# Patient Record
Sex: Female | Born: 1950 | Race: White | Hispanic: No | Marital: Married | Smoking: Former smoker
Health system: Southern US, Community
[De-identification: ages and names within clinical notes are randomized; demographics above are authoritative.]

## PROBLEM LIST (undated history)

## (undated) DIAGNOSIS — I519 Heart disease, unspecified: Secondary | ICD-10-CM

## (undated) DIAGNOSIS — D649 Anemia, unspecified: Secondary | ICD-10-CM

## (undated) DIAGNOSIS — K911 Postgastric surgery syndromes: Secondary | ICD-10-CM

## (undated) DIAGNOSIS — K52831 Collagenous colitis: Secondary | ICD-10-CM

## (undated) DIAGNOSIS — R6889 Other general symptoms and signs: Secondary | ICD-10-CM

## (undated) DIAGNOSIS — F419 Anxiety disorder, unspecified: Secondary | ICD-10-CM

## (undated) DIAGNOSIS — M1990 Unspecified osteoarthritis, unspecified site: Secondary | ICD-10-CM

## (undated) DIAGNOSIS — F329 Major depressive disorder, single episode, unspecified: Secondary | ICD-10-CM

## (undated) DIAGNOSIS — D72829 Elevated white blood cell count, unspecified: Secondary | ICD-10-CM

## (undated) DIAGNOSIS — I890 Lymphedema, not elsewhere classified: Secondary | ICD-10-CM

## (undated) DIAGNOSIS — F32A Depression, unspecified: Secondary | ICD-10-CM

## (undated) DIAGNOSIS — K2900 Acute gastritis without bleeding: Secondary | ICD-10-CM

## (undated) DIAGNOSIS — K8689 Other specified diseases of pancreas: Secondary | ICD-10-CM

## (undated) DIAGNOSIS — K2270 Barrett's esophagus without dysplasia: Secondary | ICD-10-CM

## (undated) DIAGNOSIS — E785 Hyperlipidemia, unspecified: Secondary | ICD-10-CM

## (undated) DIAGNOSIS — I10 Essential (primary) hypertension: Secondary | ICD-10-CM

## (undated) DIAGNOSIS — K5790 Diverticulosis of intestine, part unspecified, without perforation or abscess without bleeding: Secondary | ICD-10-CM

## (undated) HISTORY — DX: Acute gastritis without bleeding: K29.00

## (undated) HISTORY — DX: Anemia, unspecified: D64.9

## (undated) HISTORY — DX: Barrett's esophagus without dysplasia: K22.70

## (undated) HISTORY — DX: Collagenous colitis: K52.831

## (undated) HISTORY — DX: Other specified diseases of pancreas: K86.89

## (undated) HISTORY — DX: Heart disease, unspecified: I51.9

## (undated) HISTORY — DX: Major depressive disorder, single episode, unspecified: F32.9

## (undated) HISTORY — DX: Postgastric surgery syndromes: K91.1

## (undated) HISTORY — DX: Anxiety disorder, unspecified: F41.9

## (undated) HISTORY — DX: Lymphedema, not elsewhere classified: I89.0

## (undated) HISTORY — DX: Elevated white blood cell count, unspecified: D72.829

## (undated) HISTORY — PX: TONSILLECTOMY: SUR1361

## (undated) HISTORY — DX: Other general symptoms and signs: R68.89

## (undated) HISTORY — DX: Hyperlipidemia, unspecified: E78.5

## (undated) HISTORY — DX: Unspecified osteoarthritis, unspecified site: M19.90

## (undated) HISTORY — DX: Depression, unspecified: F32.A

## (undated) HISTORY — DX: Essential (primary) hypertension: I10

## (undated) HISTORY — DX: Diverticulosis of intestine, part unspecified, without perforation or abscess without bleeding: K57.90

## (undated) HISTORY — PX: TUBAL LIGATION: SHX77

---

## 1998-07-17 ENCOUNTER — Other Ambulatory Visit: Admission: RE | Admit: 1998-07-17 | Discharge: 1998-07-17 | Payer: Self-pay | Admitting: Obstetrics and Gynecology

## 1999-12-16 ENCOUNTER — Other Ambulatory Visit: Admission: RE | Admit: 1999-12-16 | Discharge: 1999-12-16 | Payer: Self-pay | Admitting: Obstetrics and Gynecology

## 2001-06-26 ENCOUNTER — Other Ambulatory Visit: Admission: RE | Admit: 2001-06-26 | Discharge: 2001-06-26 | Payer: Self-pay | Admitting: Obstetrics and Gynecology

## 2004-12-17 ENCOUNTER — Encounter: Admission: RE | Admit: 2004-12-17 | Discharge: 2004-12-17 | Payer: Self-pay | Admitting: Internal Medicine

## 2005-02-11 ENCOUNTER — Encounter: Admission: RE | Admit: 2005-02-11 | Discharge: 2005-02-11 | Payer: Self-pay | Admitting: Internal Medicine

## 2007-05-24 DIAGNOSIS — R6889 Other general symptoms and signs: Secondary | ICD-10-CM

## 2007-05-24 HISTORY — DX: Other general symptoms and signs: R68.89

## 2008-02-20 DIAGNOSIS — K8689 Other specified diseases of pancreas: Secondary | ICD-10-CM

## 2008-02-20 HISTORY — DX: Other specified diseases of pancreas: K86.89

## 2010-02-11 ENCOUNTER — Encounter: Payer: Self-pay | Admitting: Family Medicine

## 2010-02-18 ENCOUNTER — Ambulatory Visit: Payer: Self-pay | Admitting: Family Medicine

## 2010-02-18 DIAGNOSIS — F411 Generalized anxiety disorder: Secondary | ICD-10-CM

## 2010-02-18 DIAGNOSIS — M359 Systemic involvement of connective tissue, unspecified: Secondary | ICD-10-CM

## 2010-02-18 DIAGNOSIS — I10 Essential (primary) hypertension: Secondary | ICD-10-CM | POA: Insufficient documentation

## 2010-03-17 ENCOUNTER — Encounter: Payer: Self-pay | Admitting: Family Medicine

## 2010-03-17 LAB — CONVERTED CEMR LAB
Creatinine, Ser: 0.62 mg/dL
GFR calc Af Amer: 118.81 mL/min
GFR calc non Af Amer: 98.19 mL/min
Potassium: 4.9 meq/L

## 2010-03-23 ENCOUNTER — Telehealth: Payer: Self-pay | Admitting: Family Medicine

## 2010-04-02 ENCOUNTER — Encounter: Payer: Self-pay | Admitting: Emergency Medicine

## 2010-04-02 ENCOUNTER — Observation Stay (HOSPITAL_COMMUNITY): Admission: AD | Admit: 2010-04-02 | Discharge: 2010-04-03 | Payer: Self-pay | Admitting: Internal Medicine

## 2010-04-02 ENCOUNTER — Ambulatory Visit: Payer: Self-pay | Admitting: Cardiology

## 2010-04-02 ENCOUNTER — Ambulatory Visit: Payer: Self-pay | Admitting: Diagnostic Radiology

## 2010-04-03 ENCOUNTER — Encounter: Payer: Self-pay | Admitting: Family Medicine

## 2010-04-03 ENCOUNTER — Encounter (INDEPENDENT_AMBULATORY_CARE_PROVIDER_SITE_OTHER): Payer: Self-pay | Admitting: Internal Medicine

## 2010-04-14 ENCOUNTER — Ambulatory Visit: Payer: Self-pay | Admitting: Cardiology

## 2010-04-14 ENCOUNTER — Ambulatory Visit: Payer: Self-pay

## 2010-04-19 ENCOUNTER — Telehealth: Payer: Self-pay | Admitting: Family Medicine

## 2010-04-19 ENCOUNTER — Ambulatory Visit: Payer: Self-pay | Admitting: Family Medicine

## 2010-04-19 DIAGNOSIS — D72829 Elevated white blood cell count, unspecified: Secondary | ICD-10-CM

## 2010-04-19 DIAGNOSIS — E785 Hyperlipidemia, unspecified: Secondary | ICD-10-CM

## 2010-04-19 DIAGNOSIS — F17200 Nicotine dependence, unspecified, uncomplicated: Secondary | ICD-10-CM

## 2010-05-07 ENCOUNTER — Ambulatory Visit: Payer: Self-pay | Admitting: Internal Medicine

## 2010-05-25 ENCOUNTER — Ambulatory Visit: Admit: 2010-05-25 | Payer: Self-pay | Admitting: Cardiology

## 2010-06-01 ENCOUNTER — Ambulatory Visit: Admit: 2010-06-01 | Payer: Self-pay | Admitting: Family Medicine

## 2010-06-18 ENCOUNTER — Ambulatory Visit: Payer: Self-pay | Admitting: Internal Medicine

## 2010-06-20 LAB — CONVERTED CEMR LAB
Alkaline Phosphatase: 81 units/L
BUN: 8 mg/dL
Creatinine, Ser: 0.62 mg/dL
GFR calc Af Amer: 118.81 mL/min
GFR calc non Af Amer: 98.19 mL/min
Total Bilirubin: 0.3 mg/dL

## 2010-06-22 ENCOUNTER — Encounter: Payer: Self-pay | Admitting: Family Medicine

## 2010-06-22 LAB — CBC WITH DIFFERENTIAL/PLATELET
BASO%: 0.4 % (ref 0.0–2.0)
EOS%: 2.4 % (ref 0.0–7.0)
HCT: 42.8 % (ref 34.8–46.6)
LYMPH%: 38 % (ref 14.0–49.7)
MCH: 31.5 pg (ref 25.1–34.0)
MCHC: 33.7 g/dL (ref 31.5–36.0)
MCV: 93.7 fL (ref 79.5–101.0)
MONO#: 0.9 10*3/uL (ref 0.1–0.9)
MONO%: 5.3 % (ref 0.0–14.0)
NEUT%: 53.9 % (ref 38.4–76.8)
Platelets: 274 10*3/uL (ref 145–400)

## 2010-06-24 LAB — COMPREHENSIVE METABOLIC PANEL
AST: 20 U/L (ref 0–37)
Albumin: 4.8 g/dL (ref 3.5–5.2)
Alkaline Phosphatase: 84 U/L (ref 39–117)
BUN: 13 mg/dL (ref 6–23)
Glucose, Bld: 80 mg/dL (ref 70–99)
Potassium: 4.2 mEq/L (ref 3.5–5.3)
Sodium: 137 mEq/L (ref 135–145)
Total Bilirubin: 0.3 mg/dL (ref 0.3–1.2)
Total Protein: 7.6 g/dL (ref 6.0–8.3)

## 2010-06-24 LAB — LEUKOCYTE ALKALINE PHOSPHATASE: Leukocyte Alkaline Phos Stain: 70 (ref 30–140)

## 2010-06-24 NOTE — Assessment & Plan Note (Signed)
Summary: NOV: New Dx HTN   Vital Signs:  Patient profile:   60 year old female Height:      60.5 inches Weight:      103 pounds Pulse rate:   85 / minute BP sitting:   172 / 86  (right arm) Cuff size:   regular  Vitals Entered By: Avon Gully CMA, Duncan Dull) (February 18, 2010 2:41 PM) CC: NP-est care   CC:  NP-est care.  History of Present Illness: BP has been running high for almost a year. Home BPs this last couple of weeks running in the 160-180s.   About 6 years ago started having panic attacks and has been taking her sertraline and xanax.  Has been on same meds since then. Says they do work well for her.   Was having nocturnal diarrhea.  Seen at [facility] by Dr. Alycia Rossetti and had colllignous colitis and told had a dumping syndrome.  Then put on dicyclomine which has helped with the pian but still has the diarrhea.  + family history.    Habits & Providers  Alcohol-Tobacco-Diet     Alcohol drinks/day: 0     Tobacco Status: current     Cigarette Packs/Day: 1.0  Exercise-Depression-Behavior     Does Patient Exercise: no     STD Risk: never     Drug Use: no     Seat Belt Use: always  Current Medications (verified): 1)  Sertraline Hcl 100 Mg Tabs (Sertraline Hcl) .... Take One Tablet By Mouth Once A Day 2)  Sertraline Hcl 50 Mg Tabs (Sertraline Hcl) .... Take One Tablet By Mouth Once A Day 3)  Dicyclomine Hcl 20 Mg Tabs (Dicyclomine Hcl) .... Take One Tablet By Mouth Three Times A Week 4)  Xanax 1 Mg Tabs (Alprazolam) .... Take One Tablet By Mouth Once A Day  Allergies (verified): 1)  ! * Atropine 2)  ! Demerol 3)  ! Toradol  Comments:  Nurse/Medical Assistant: The patient's medications and allergies were reviewed with the patient and were updated in the Medication and Allergy Lists. Avon Gully CMA, Duncan Dull) (February 18, 2010 2:46 PM)  Past History:  Past Medical History: None  Past Surgical History: None  Family History: Mother with BrCa Mom with DM, hi  chol, HTN.   Social History: HS degree.  Marrid to [place] with 2 adult kids.  Current Smoker Alcohol use-no Drug use-no Regular exercise-no 2 caffeinated drinks per day.  Smoking Status:  current Packs/Day:  1.0 Does Patient Exercise:  no STD Risk:  never Drug Use:  no Seat Belt Use:  always  Review of Systems       No fever/sweats/weakness, unexplained weight loss/gain.  No vison changes.  No difficulty hearing/ringing in ears, hay fever/allergies.  No chest pain/discomfort, palpitations.  No Br lump/nipple discharge.  No cough/wheeze.  No blood in BM, + nausea/vomiting/diarrhea.  No nighttime urination, leaking urine, unusual vaginal bleeding, discharge (penis or vagina).  No muscle/joint pain. No rash, change in mole.  No HA, memory loss.  + anxiety, sleep d/o, depression.  No easy bruising/bleeding, unexplained lump. + concer wtih sexual function.   Physical Exam  General:  Well-developed,well-nourished,in no acute distress; alert,appropriate and cooperative throughout examination Neck:  No deformities, masses, or tenderness noted. NO TM.  Lungs:  Normal respiratory effort, chest expands symmetrically. Lungs are clear to auscultation, no crackles or wheezes. Heart:  Normal rate and regular rhythm. S1 and S2 normal without gallop, murmur, click, rub or other extra sounds. nO  carotid or abdominal bruits.  Pulses:  Radial 2+  Extremities:  No LE edema.  Neurologic:  alert & oriented X3.   Skin:  no rashes.   Cervical Nodes:  No lymphadenopathy noted Psych:  Cognition and judgment appear intact. Alert and cooperative with normal attention span and concentration. No apparent delusions, illusions, hallucinations   Impression & Recommendations:  Problem # 1:  HYPERTENSION, MILD (ICD-401.1) Assessment New Discussednew dx. Discussed exercies and DASH diet.  Also discussed med and potential SE. will start with ACE and diuretic combo. Will get basic labs as well. Check Cr after starts  the ACEi.  F/U in 3 weeks.  Encouraged smoking  cessation.  Her updated medication list for this problem includes:    Lisinopril-hydrochlorothiazide 10-12.5 Mg Tabs (Lisinopril-hydrochlorothiazide) .Marland Kitchen... Take 1 tablet by mouth once a day in the am.  Orders: T-Lipid Profile ([id number]) T-Basic Metabolic Panel ([id number])  Problem # 2:  ANXIETY DISORDER, GENERALIZED (ICD-300.02) Stable on current regimen.  Her updated medication list for this problem includes:    Sertraline Hcl 100 Mg Tabs (Sertraline hcl) .Marland Kitchen... Take one tablet by mouth once a day    Sertraline Hcl 50 Mg Tabs (Sertraline hcl) .Marland Kitchen... Take one tablet by mouth once a day    Xanax 1 Mg Tabs (Alprazolam) .Marland Kitchen... Take one tablet by mouth once a day  Complete Medication List: 1)  Sertraline Hcl 100 Mg Tabs (Sertraline hcl) .... Take one tablet by mouth once a day 2)  Sertraline Hcl 50 Mg Tabs (Sertraline hcl) .... Take one tablet by mouth once a day 3)  Dicyclomine Hcl 20 Mg Tabs (Dicyclomine hcl) .... Take one tablet by mouth three times a week 4)  Xanax 1 Mg Tabs (Alprazolam) .... Take one tablet by mouth once a day 5)  Lisinopril-hydrochlorothiazide 10-12.5 Mg Tabs (Lisinopril-hydrochlorothiazide) .... Take 1 tablet by mouth once a day in the am.  Patient Instructions: 1)  Google DASH diet (http://www.myers.net/ website) and start this 2)  Start new blood pressure medication 3)  Go to the lab in one week. No appt needed 4)  Please schedule a follow-up appointment in 3 weeks.   Contraindications/Deferment of Procedures/Staging:    Test/Procedure: Colonoscopy    Reason for deferment: not indicated  Prescriptions: LISINOPRIL-HYDROCHLOROTHIAZIDE 10-12.5 MG TABS (LISINOPRIL-HYDROCHLOROTHIAZIDE) Take 1 tablet by mouth once a day in the AM.  #30 x 1   Entered and Authorized by:   Nani Gasser MD   Signed by:   Nani Gasser MD on 02/18/2010   Method used:   [facility]  [address] [phone][facility] (retail[facility]        [address]       [place], [place]  [address]       Ph: [phone] or [phone]       Fax: [phone]   RxID:   [id number]

## 2010-06-24 NOTE — Progress Notes (Signed)
Summary: Cholesterol med  Phone Note From Pharmacy   [facility] [phone] Call For: [facility]  Summary of Call: Pharmacy calls and states they received a rx for Lipitor and Crestor for this patient and wanted to know which one you wanted her on Initial call taken by: Kathlene November LPN,  April 19, 2010 11:59 AM  Follow-up for Phone Call        Sorry the lipitor. She changed her mind after I sent the crestor.  Follow-up by: Nani Gasser MD,  April 19, 2010 12:51 PM  Additional Follow-up for Phone Call Additional follow up Details #1::        Called pharmacy and informed them of above Additional Follow-up by: Kathlene November LPN,  April 19, 2010 1:03 PM

## 2010-06-24 NOTE — Letter (Signed)
Summary: GI Records Dated 02-20-08 thru 06-11-09/[facility]  GI Records Dated 02-20-08 thru 06-11-09/[facility]   Imported By: Lanelle Bal 05/19/2010 12:23:29  _____________________________________________________________________  External Attachment:    Type:   Image     Comment:   External Document

## 2010-06-24 NOTE — Progress Notes (Signed)
Summary: meds  Phone Note Call from Patient Call back at [phone]   Caller: Patient Summary of Call: Pt called and states her BP medicine is making her have HA's and a bad cough.FYI pt states she weighs 103 lbs if needed to know for med adjustment  Initial call taken by: Avon Gully CMA, Duncan Dull),  March 23, 2010 10:30 AM  Follow-up for Phone Call        [facility] lets change med and then keep her f/u.  Follow-up by: Nani Gasser MD,  March 23, 2010 10:49 AM  Additional Follow-up for Phone Call Additional follow up Details #1::        Pt notified of MD instructions and that med sent to pharmacy Additional Follow-up by: Kathlene November LPN,  March 23, 2010 2:28 PM    New/Updated Medications: LOSARTAN POTASSIUM 50 MG TABS (LOSARTAN POTASSIUM) 1/2 tab daily for one week, then inc to whole tab daily Prescriptions: LOSARTAN POTASSIUM 50 MG TABS (LOSARTAN POTASSIUM) 1/2 tab daily for one week, then inc to whole tab daily  #30 x 0   Entered and Authorized by:   Nani Gasser MD   Signed by:   Nani Gasser MD on 03/23/2010   Method used:   [facility]        [facility]  [address] [phone]* (retail)       [address]       [place], [place]  [address]       Ph: [phone] or [phone]       Fax: [phone]   RxID:   [id number]

## 2010-06-24 NOTE — Assessment & Plan Note (Signed)
Summary: hospital f/u   Vital Signs:  Patient profile:   60 year old female Height:      60.5 inches Weight:      100 pounds Pulse rate:   91 / minute BP sitting:   123 / 78  (right arm) Cuff size:   regular  Vitals Entered By: Avon Gully CMA, Duncan Dull) (April 19, 2010 10:49 AM) CC: hosp f/u    Primary Care Aitan Rossbach:  Nani Gasser MD  CC:  hosp f/u .  History of Present Illness: She is off her BP medication because of SE. The losartan lowered her pressure too much adn she felt bad.  Her BP has been great off the medication. She is not on it today. she is admitted to [facility] on November 11 and discharged on November 12.  Her primary discharge diagnoses were chest pain of unclear etiology.  She had negative cardiac enzymes cycled x 3.  She has not had any more chest pains and she has been home.  She is oriented her follow-up bulbar cardiology and had a nuclear stress test which was essentially negative.  It was noted that she had leukocytosis on her lab work well in the hospital.  She says this has been elevated for over a year at this point.  It was felt to likely be elevated because of her collagenous colitis.  She had been seen for this previously at [facility] with Dr. Alycia Rossetti.  She was told that time that she did have some swollen lymph nodes around her liver.  They did recommend that she see a hematologist for consult.  Will be happy to make a referral for her today.  She is otherwise continued on her regular medications and has started a baby aspirin once daily.  she would really like to quit smoking.  She is decided against the Chantix.  She would like to try the Nicorette gum.  Current Medications (verified): 1)  Sertraline Hcl 100 Mg Tabs (Sertraline Hcl) .... Take Two  Tablet By Mouth Once A Day 2)  Dicyclomine Hcl 20 Mg Tabs (Dicyclomine Hcl) .... Take One Tablet By Mouth Three Times A Day 3)  Xanax 1 Mg Tabs (Alprazolam) .... Take 1 Tablet By Mouth Three Times A  Day 4)  Aspirin 81 Mg Chew (Aspirin) .... Take One Tablet By Mouth Once A Day 5)  Lipitor 40 Mg Tabs (Atorvastatin Calcium) .... Take 1 Tablet By Mouth Once A Day At Bedtime 6)  Nicorette 2 Mg Gum (Nicotine Polacrilex) .... Chew One Q1-2 Hours As Needed X 6 Weeks, Ten Drop To !2-4 Hours For 3 Weeks.  Allergies (verified): 1)  ! * Atropine 2)  ! Demerol 3)  ! Toradol  Comments:  Nurse/Medical Assistant: The patient's medications and allergies were reviewed with the patient and were updated in the Medication and Allergy Lists. Avon Gully CMA, Duncan Dull) (April 19, 2010 10:51 AM)  Past History:  Social History: Last updated: 02/18/2010 HS degree.  Marrid to [place] with 2 adult kids.  Current Smoker Alcohol use-no Drug use-no Regular exercise-no 2 caffeinated drinks per day.   Physical Exam  General:  Well-developed,well-nourished,in no acute distress; alert,appropriate and cooperative throughout examination Head:  Normocephalic and atraumatic without obvious abnormalities. No apparent alopecia or balding. Lungs:  Normal respiratory effort, chest expands symmetrically. Lungs are clear to auscultation, no crackles or wheezes. Heart:  Normal rate and regular rhythm. S1 and S2 normal without gallop, murmur, click, rub or other extra sounds. Pulses:  Radial 2+ bilat.  Skin:  no rashes.  no rashes.   Psych:  Cognition and judgment appear intact. Alert and cooperative with normal attention span and concentration. No apparent delusions, illusions, hallucinations   Impression & Recommendations:  Problem # 1:  HYPERLIPIDEMIA (ICD-272.4)  we discussed options.  He recommend a statin.  No have a copy of her cholesterol results but she says her total was over 300.  We will start Lipitor since it is going generic at the end of this month.  Currently this will be reasonably affordable.  Will recheck levels and liver enzymes in 6 weeks. Her updated medication list for this problem  includes:    Lipitor 40 Mg Tabs (Atorvastatin calcium) .Marland Kitchen... Take 1 tablet by mouth once a day at bedtime  Problem # 2:  LEUKOCYTOSIS UNSPECIFIED (ICD-288.60) Will refill refer her to hematology for consult.  She is having to get his in before the new years and she is not her deductible already for this year. This certainly could be related to her collagenous colitis.  We will [facility] to get notes from [facility].  Orders: Hematology Referral (Hematology)  Problem # 3:  HYPERTENSION, MILD (ICD-401.1) really her blood pressure looks fantastic off of her blood pressure medications. will continue to monitor her off of her medications will recheck her blood pressure in 6 weeks. The following medications were removed from the medication list:    Losartan Potassium 50 Mg Tabs (Losartan potassium) .Marland Kitchen... 1/2 tab daily for one week, then inc to whole tab daily    Lisinopril-hydrochlorothiazide 20-12.5 Mg Tabs (Lisinopril-hydrochlorothiazide) .Marland Kitchen... Take one tablet by mouth once a day  Problem # 4:  ANXIETY DISORDER, GENERALIZED (ICD-300.02) she is due for refill on her Xanax today.  She did increase the circumflex and 2 mg on her own and says she typically does is in the winter time because she does have seasonal affective disorder. The following medications were removed from the medication list:    Sertraline Hcl 50 Mg Tabs (Sertraline hcl) .Marland Kitchen... Take one tablet by mouth once a day Her updated medication list for this problem includes:    Sertraline Hcl 100 Mg Tabs (Sertraline hcl) .Marland Kitchen... Take two  tablet by mouth once a day    Xanax 1 Mg Tabs (Alprazolam) .Marland Kitchen... Take 1 tablet by mouth three times a day  Problem # 5:  TOBACCO ABUSE (ICD-305.1) we discussed options.  She would like to start with a gun.  I did give her a prescription to see if it might be cheaper than buying over-the-counter.  We also reviewed how to properly bite on the dominant part in her cheek and said actually chewing on it like  chewing. Her updated medication list for this problem includes:    Nicorette 2 Mg Gum (Nicotine polacrilex) .Marland Kitchen... Chew one q1-2 hours as needed x 6 weeks, ten drop to !2-4 hours for 3 weeks.  Complete Medication List: 1)  Sertraline Hcl 100 Mg Tabs (Sertraline hcl) .... Take two  tablet by mouth once a day 2)  Dicyclomine Hcl 20 Mg Tabs (Dicyclomine hcl) .... Take one tablet by mouth three times a day 3)  Xanax 1 Mg Tabs (Alprazolam) .... Take 1 tablet by mouth three times a day 4)  Aspirin 81 Mg Chew (Aspirin) .... Take one tablet by mouth once a day 5)  Lipitor 40 Mg Tabs (Atorvastatin calcium) .... Take 1 tablet by mouth once a day at bedtime 6)  Nicorette 2 Mg Gum (Nicotine  polacrilex) .... Chew one q1-2 hours as needed x 6 weeks, ten drop to !2-4 hours for 3 weeks.  Patient Instructions: 1)  Please schedule a follow-up appointment in 6 weeks. Come fasting so we can recheck your cholesterol and liver function.  Prescriptions: NICORETTE 2 MG GUM (NICOTINE POLACRILEX) Chew one Q1-2 hours as needed x 6 weeks, ten drop to !2-4 hours for 3 weeks.  #30 day supp x 1   Entered and Authorized by:   Nani Gasser MD   Signed by:   Nani Gasser MD on 04/19/2010   Method used:   [facility]        [facility]  [address] [phone]* (retail)       [address]       [place], [place]  [address]       Ph: [phone] or [phone]       Fax: [phone]   RxID:   [id number] Prudy Feeler 1 MG TABS (ALPRAZOLAM) Take 1 tablet by mouth three times a day  #90 x 2   Entered and Authorized by:   Nani Gasser MD   Signed by:   Nani Gasser MD on 04/19/2010   Method used:   Printed then faxed to ...       [facility]  [address] [phone]* (retail)       [address]       [place], [place]  [address]       Ph: [phone] or [phone]       Fax: [phone]   RxID:   [id number] DICYCLOMINE HCL 20 MG TABS (DICYCLOMINE HCL) take one tablet by mouth three times a week  #120 x 6    Entered and Authorized by:   Nani Gasser MD   Signed by:   Nani Gasser MD on 04/19/2010   Method used:   Electronically to        [facility]  [address] [phone]* (retail)       [address]       [place], [place]  [address]       Ph: [phone] or [phone]       Fax: [phone]   RxID:   [id number] SERTRALINE HCL 100 MG TABS (SERTRALINE HCL) take two  tablet by mouth once a day  #60 x 6   Entered and Authorized by:   Nani Gasser MD   Signed by:   Nani Gasser MD on 04/19/2010   Method used:   Electronically to        [facility]  [address] [phone]* (retail)       [address]       [place], [place]  [address]       Ph: [phone] or [phone]       Fax: [phone]   RxID:   [id number] LIPITOR 40 MG TABS (ATORVASTATIN CALCIUM) Take 1 tablet by mouth once a day at bedtime  #30 x 1   Entered and Authorized by:   Nani Gasser MD   Signed by:   Nani Gasser MD on 04/19/2010   Method used:   Electronically to        [facility]  [address] [phone]* (retail)       [address]       [place], [place]  [address]       Ph: [phone] or [phone]       Fax: [phone]   RxID:   [id number] CRESTOR 10 MG TABS (ROSUVASTATIN CALCIUM) Take 1 tablet by mouth once a day  #  30 x 0   Entered and Authorized by:   Nani Gasser MD   Signed by:   Nani Gasser MD on 04/19/2010   Method used:   Electronically to        [facility]  [address] [phone]* (retail)       [address]       [place], [place]  [address]       Ph: [phone] or [phone]       Fax: [phone]   RxID:   [id number]    Orders Added: 1)  Hematology Referral [Hematology] 2)  Est. Patient Level IV [[address]]

## 2010-06-24 NOTE — Letter (Signed)
Summary: [facility]  [facility]   Imported By: Maryln Gottron 04/29/2010 14:01:55  _____________________________________________________________________  External Attachment:    Type:   Image     Comment:   External Document

## 2010-06-25 ENCOUNTER — Telehealth: Payer: Self-pay | Admitting: Family Medicine

## 2010-06-25 LAB — BCR/ABL (LIO MMD)

## 2010-07-06 ENCOUNTER — Other Ambulatory Visit: Payer: Self-pay | Admitting: Internal Medicine

## 2010-07-06 ENCOUNTER — Encounter: Payer: Self-pay | Admitting: Family Medicine

## 2010-07-06 ENCOUNTER — Encounter (HOSPITAL_BASED_OUTPATIENT_CLINIC_OR_DEPARTMENT_OTHER): Payer: BC Managed Care – PPO | Admitting: Internal Medicine

## 2010-07-06 DIAGNOSIS — D72829 Elevated white blood cell count, unspecified: Secondary | ICD-10-CM

## 2010-07-06 LAB — CBC WITH DIFFERENTIAL/PLATELET
Basophils Absolute: 0.1 10*3/uL (ref 0.0–0.1)
Eosinophils Absolute: 0.3 10*3/uL (ref 0.0–0.5)
HCT: 42.7 % (ref 34.8–46.6)
HGB: 14.1 g/dL (ref 11.6–15.9)
MCH: 31.3 pg (ref 25.1–34.0)
NEUT#: 6.7 10*3/uL — ABNORMAL HIGH (ref 1.5–6.5)
NEUT%: 54.4 % (ref 38.4–76.8)
RDW: 13.3 % (ref 11.2–14.5)
lymph#: 4.5 10*3/uL — ABNORMAL HIGH (ref 0.9–3.3)

## 2010-07-08 NOTE — Progress Notes (Signed)
Summary: referral   Phone Note Call from Patient   Summary of Call: Patient was discharged from the hospital last month. The hemotologist-oncologist wants her to get a referral  from her primary to Dr.Gessner for Gastroenterology.... Call patient back at [phone] with the status..Thanks.Michaelle Copas  June 25, 2010 9:32 AM  Initial call taken by: Michaelle Copas,  June 25, 2010 9:32 AM  Follow-up for Phone Call        Really needs a f/u. I dont' even have notes about her hospitalization to even know why she needs GI referral. Follow-up by: Nani Gasser MD,  June 28, 2010 8:03 AM  Additional Follow-up for Phone Call Additional follow up Details #1::        pt notified Additional Follow-up by: Avon Gully CMA, Duncan Dull),  June 28, 2010 1:48 PM

## 2010-07-29 NOTE — Letter (Signed)
Summary: [facility]  [facility]   Imported By: Lanelle Bal 07/23/2010 11:23:00  _____________________________________________________________________  External Attachment:    Type:   Image     Comment:   External Document

## 2010-08-03 LAB — POCT CARDIAC MARKERS: Myoglobin, poc: 16.1 ng/mL (ref 12–200)

## 2010-08-03 LAB — VITAMIN B12: Vitamin B-12: 1664 pg/mL — ABNORMAL HIGH (ref 211–911)

## 2010-08-03 LAB — CARDIAC PANEL(CRET KIN+CKTOT+MB+TROPI)
CK, MB: 0.8 ng/mL (ref 0.3–4.0)
CK, MB: 0.9 ng/mL (ref 0.3–4.0)
Relative Index: INVALID (ref 0.0–2.5)
Total CK: 24 U/L (ref 7–177)
Total CK: 27 U/L (ref 7–177)

## 2010-08-03 LAB — BASIC METABOLIC PANEL
CO2: 19 mEq/L (ref 19–32)
Chloride: 109 mEq/L (ref 96–112)
GFR calc Af Amer: >60 mL/min (ref >60)
Glucose, Bld: 89 mg/dL (ref 70–99)
Potassium: 3.8 mEq/L (ref 3.5–5.1)
Sodium: 142 mEq/L (ref 135–145)

## 2010-08-03 LAB — DIFFERENTIAL
Basophils Relative: 1 % (ref 0–1)
Eosinophils Absolute: 0.3 10*3/uL (ref 0.0–0.7)
Lymphs Abs: 6 10*3/uL — ABNORMAL HIGH (ref 0.7–4.0)
Monocytes Absolute: 0.9 10*3/uL (ref 0.1–1.0)
Monocytes Relative: 7 % (ref 3–12)
Neutrophils Relative %: 46 % (ref 43–77)

## 2010-08-03 LAB — LIPID PANEL
Cholesterol: 244 mg/dL — ABNORMAL HIGH (ref 0–200)
LDL Cholesterol: 148 mg/dL — ABNORMAL HIGH (ref 0–99)
VLDL: 50 mg/dL — ABNORMAL HIGH (ref 0–40)

## 2010-08-03 LAB — COMPREHENSIVE METABOLIC PANEL
ALT: 9 U/L (ref 0–35)
Alkaline Phosphatase: 75 U/L (ref 39–117)
CO2: 21 mEq/L (ref 19–32)
GFR calc non Af Amer: >60 mL/min (ref >60)
Glucose, Bld: 93 mg/dL (ref 70–99)
Potassium: 3.6 mEq/L (ref 3.5–5.1)
Sodium: 140 mEq/L (ref 135–145)
Total Bilirubin: 0.6 mg/dL (ref 0.3–1.2)

## 2010-08-03 LAB — CBC
HCT: 42 % (ref 36.0–46.0)
HCT: 42.5 % (ref 36.0–46.0)
Hemoglobin: 14.6 g/dL (ref 12.0–15.0)
Hemoglobin: 14.7 g/dL (ref 12.0–15.0)
MCH: 32.4 pg (ref 26.0–34.0)
MCHC: 34.6 g/dL (ref 30.0–36.0)
MCV: 93.7 fL (ref 78.0–100.0)
RBC: 4.54 MIL/uL (ref 3.87–5.11)
WBC: 13.9 10*3/uL — ABNORMAL HIGH (ref 4.0–10.5)

## 2010-08-03 LAB — URINALYSIS, ROUTINE W REFLEX MICROSCOPIC
Bilirubin Urine: NEGATIVE
Nitrite: NEGATIVE
Specific Gravity, Urine: 1.011 (ref 1.005–1.030)
Urobilinogen, UA: 0.2 mg/dL (ref 0.0–1.0)

## 2010-08-03 LAB — HEMOGLOBIN A1C: Hgb A1c MFr Bld: 5.6 % (ref <5.7)

## 2010-08-05 ENCOUNTER — Telehealth: Payer: Self-pay | Admitting: Family Medicine

## 2010-08-06 ENCOUNTER — Encounter: Payer: Self-pay | Admitting: Gastroenterology

## 2010-08-10 NOTE — Letter (Signed)
Summary: [facility]  [facility]   Imported By: Maryln Gottron 08/03/2010 15:52:07  _____________________________________________________________________  External Attachment:    Type:   Image     Comment:   External Document

## 2010-08-10 NOTE — Letter (Signed)
Summary: New Patient letter  [facility]  [address] [place], [place] [address]   Phone: [phone]  Fax: [phone]       08/06/2010 MRN: [id number]  [facility] [address] [place], [place]  [address]  Dear Ms. Rodda,  Welcome to the Gastroenterology Division at [facility].    You are scheduled to see Dr.  Jarold Motto on 09-10-10 at [facility] on the 3rd floor at [facility], [address]  We ask that you try to arrive at our office 15 minutes prior to your appointment time to allow for check-in.  We would like you to complete the enclosed self-administered evaluation form prior to your visit and bring it with you on the day of your appointment.  We will review it with you.  Also, please bring a complete list of all your medications or, if you prefer, bring the medication bottles and we will list them.  Please bring your insurance card so that we may make a copy of it.  If your insurance requires a referral to see a specialist, please bring your referral form from your primary care physician.  Co-payments are due at the time of your visit and may be paid by cash, check or credit card.     Your office visit will consist of a consult with your physician (includes a physical exam), any laboratory testing he/she may order, scheduling of any necessary diagnostic testing (e.g. x-ray, ultrasound, CT-scan), and scheduling of a procedure (e.g. Endoscopy, Colonoscopy) if required.  Please allow enough time on your schedule to allow for any/all of these possibilities.    If you cannot keep your appointment, please call [phone] to cancel or reschedule prior to your appointment date.  This allows [place] the opportunity to schedule an appointment for another patient in need of care.  If you do not cancel or reschedule by 5 p.m. the business day prior to your appointment date, you will be charged a $50.00 late cancellation/no-show fee.    Thank you for choosing [facility] for your medical needs.  We appreciate the opportunity to care for you.  Please visit [place] at our website  to learn more about our practice.                     Sincerely,                                                             The Gastroenterology Division

## 2010-08-10 NOTE — Progress Notes (Signed)
Summary: referral Request   Phone Note Call from Patient   Caller: Daughter Summary of Call: Daughter-Stephanie called and left a message stating her mother needs a GI referral and that is what Dr.Mahommad recommended for her mother. ( Dr. Shirline Frees is who Dr. Linford Arnold referred her to [facility]) Thanks.Michaelle Copas  August 05, 2010 5:05 PM  Initial call taken by: Michaelle Copas,  August 05, 2010 5:05 PM  Follow-up for Phone Call        done Follow-up by: Avon Gully CMA, Duncan Dull),  August 06, 2010 4:46 PM

## 2010-08-19 NOTE — Progress Notes (Signed)
Summary: GI referral  Phone Note Call from Patient   Caller: Daughter Judeth Cornfield Summary of Call: Daughter [facility] stating Pt needs GI referral ASAP. She has been seeing MD at [facility] and they suggested GI referral but it needed to come from you. Pt is very sick and needs ASAP. Please advise. Initial call taken by: Payton Spark CMA,  August 05, 2010 1:38 PM  Follow-up for Phone Call        Order put in.  Follow-up by: Nani Gasser MD,  August 05, 2010 3:25 PM  Additional Follow-up for Phone Call Additional follow up Details #1::        daughter was notified on Friday that order has been sent.gave her info about appt Additional Follow-up by: Avon Gully CMA, Duncan Dull),  August 09, 2010 11:11 AM

## 2010-09-10 ENCOUNTER — Ambulatory Visit: Payer: BC Managed Care – PPO | Admitting: Gastroenterology

## 2010-10-05 ENCOUNTER — Ambulatory Visit (INDEPENDENT_AMBULATORY_CARE_PROVIDER_SITE_OTHER): Payer: BC Managed Care – PPO | Admitting: Family Medicine

## 2010-10-05 ENCOUNTER — Encounter: Payer: Self-pay | Admitting: Family Medicine

## 2010-10-05 VITALS — BP 142/83 | HR 76 | Ht 60.5 in | Wt 104.0 lb

## 2010-10-05 DIAGNOSIS — K5289 Other specified noninfective gastroenteritis and colitis: Secondary | ICD-10-CM

## 2010-10-05 DIAGNOSIS — M359 Systemic involvement of connective tissue, unspecified: Secondary | ICD-10-CM

## 2010-10-05 DIAGNOSIS — K529 Noninfective gastroenteritis and colitis, unspecified: Secondary | ICD-10-CM

## 2010-10-05 DIAGNOSIS — R5383 Other fatigue: Secondary | ICD-10-CM

## 2010-10-05 DIAGNOSIS — E785 Hyperlipidemia, unspecified: Secondary | ICD-10-CM

## 2010-10-05 DIAGNOSIS — F17200 Nicotine dependence, unspecified, uncomplicated: Secondary | ICD-10-CM

## 2010-10-05 DIAGNOSIS — F411 Generalized anxiety disorder: Secondary | ICD-10-CM

## 2010-10-05 MED ORDER — ALPRAZOLAM 1 MG PO TABS: 1.0000 mg | ORAL_TABLET | Freq: Three times a day (TID) | ORAL | Status: DC | PRN

## 2010-10-05 NOTE — Assessment & Plan Note (Addendum)
Her GAD-7 score of 18.  She is still taking her sertraline bid. She feels most of her stress is from her colitis. Wants to continue her current dose. She is clearly not well controlled. She feels what she struggles most with is her fatigue and if she could improve that she would feel better.

## 2010-10-05 NOTE — Assessment & Plan Note (Signed)
Says Dr. Gwenyth Bouillon is wanting her to be seen at [facility]. She says she may wait until the fall to do this.

## 2010-10-05 NOTE — Assessment & Plan Note (Addendum)
Due to recheck on the lipitor to make sure she is at goal. She is tolerating this much better than the simvastatin. Lab slip given.

## 2010-10-05 NOTE — Progress Notes (Signed)
  Subjective:    Patient ID: Wendy Houston, female    DOB: June 23, 1950, 60 y.o.   MRN: [id number]  HPI She is here to f/u her anxiety.  She feels she  Is better but she says her biggest frustration is her coliitis and dealing with her diarrhea on a daily basis. She says often she does feel a little bit better in the summer. She saw Dr. Arbutus Ped who recommended that she see a specialist at [facility]. She thinks she will do this but she wants to wait until the fall.  Says she feels her home BPs have been well controlled. Has been taking her chol med at night faithfully.  She feels much better on this. She feels her cholesterol pill has actually helped her blood pressure. Next  Fatigue she would like to have her vitamin B12 and B6 checks. She wonders if because of her colitis she is not observing vitamins as well and this may be contributing to her lethargy.  Review of Systems     Objective:   Physical Exam  Constitutional: She is oriented to person, place, and time. She appears well-developed and well-nourished.  HENT:  Head: Normocephalic and atraumatic.  Eyes: Conjunctivae are normal. Pupils are equal, round, and reactive to light.  Neck: Neck supple. No thyromegaly present.  Cardiovascular: Normal rate and normal heart sounds.   Pulmonary/Chest: Effort normal and breath sounds normal.  Lymphadenopathy:    She has no cervical adenopathy.  Neurological: She is alert and oriented to person, place, and time.  Skin: Skin is warm and dry.       She is very tan  Psychiatric: She has a normal mood and affect.          Assessment & Plan:  Fatigue - Discussed will check her B12, vitamin D, etc. She would like to have her Vitamin B6 as well.  She doesn't have anemia per her last CBC.  If all normal then I want to consider a mood stabilizer or changing her meds for anxiety.  I suspect her vitamin D will probably need be normal as she does tan,

## 2010-10-05 NOTE — Assessment & Plan Note (Signed)
She is still smoking.

## 2010-10-06 ENCOUNTER — Encounter: Payer: Self-pay | Admitting: Family Medicine

## 2010-11-02 ENCOUNTER — Other Ambulatory Visit: Payer: Self-pay | Admitting: Family Medicine

## 2010-11-03 ENCOUNTER — Telehealth: Payer: Self-pay | Admitting: Internal Medicine

## 2010-11-03 ENCOUNTER — Encounter: Payer: Self-pay | Admitting: Internal Medicine

## 2010-11-03 NOTE — Telephone Encounter (Signed)
I spoke with the patient's sister-in-law.  The patient wants to to be seen earlier by any MD here.  I have scheduled her an appt with Dr Juanda Chance for 11/08/10.  She is asked to get the records from Dr Alycia Rossetti at [facility] from 2008 and bring them or fax them prior to the appt on Monday.

## 2010-11-04 ENCOUNTER — Encounter: Payer: Self-pay | Admitting: Internal Medicine

## 2010-11-08 ENCOUNTER — Ambulatory Visit (INDEPENDENT_AMBULATORY_CARE_PROVIDER_SITE_OTHER): Payer: BC Managed Care – PPO | Admitting: Internal Medicine

## 2010-11-08 ENCOUNTER — Encounter: Payer: Self-pay | Admitting: Internal Medicine

## 2010-11-08 VITALS — BP 130/84 | HR 76 | Ht 65.0 in | Wt 102.0 lb

## 2010-11-08 DIAGNOSIS — R197 Diarrhea, unspecified: Secondary | ICD-10-CM

## 2010-11-08 DIAGNOSIS — R933 Abnormal findings on diagnostic imaging of other parts of digestive tract: Secondary | ICD-10-CM

## 2010-11-08 DIAGNOSIS — K5289 Other specified noninfective gastroenteritis and colitis: Secondary | ICD-10-CM

## 2010-11-08 MED ORDER — CIPROFLOXACIN HCL 500 MG PO TABS: ORAL_TABLET | ORAL | Status: DC

## 2010-11-08 MED ORDER — PEG-KCL-NACL-NASULF-NA ASC-C 100 G PO SOLR: 1.0000 | Freq: Once | ORAL | Status: DC

## 2010-11-08 NOTE — Progress Notes (Signed)
Wendy Houston 1950/09/04 MRN [id number]     History of Present Illness:  This is a 60 year old Wendy Houston female with a six-year history of  diarrhea. She has watery stools which occur at night. She is having accidents. The symptoms have intensified over the past few years. She denies any rectal bleeding. The diagnosis of collagenous colitis was made on a colonoscopy by Dr.Weissman in 2006 and she was referred to [facility] for consultation. We also obtained records from Dr.Koch with an evaluation from September 2009 and January 2011. The only significant abnormality was a positive stool Elastase suggestive of pancreatic insufficiency. It seems like she had a rapid transit time based on her gastric emptying scan. She has not had a repeat endoscopy or colonoscopy. She has not had any treatment. On one occasion, she was given antibiotics by her dentist resulting in complete resolution of her diarrhea for several days. Stool studies have been negative. A TSH and celiac profile was negative. She has lost altogether about 17 pounds. There is no family history of inflammatory bowel disease. There is a history of endometriosis. She is a smoker and is trying to reduce her cigarettes to half a pack a day.      Past Medical History  Diagnosis Date  . Hyperlipidemia   . Leukocytosis     felt to be secondary to collagenous colitis; sees Dr Waymon Amato.  . Anxiety   . Collagenous colitis   . Hypertension   . Diverticulosis   . Anemia   . Other abnormal clinical finding 2009    rapid gastric emptying  . Pancreatic insufficiency 02/20/08    as per stool elastase results  . Dumping syndrome   . Lymph edema     around liver   Past Surgical History  Procedure Date  . Tubal ligation   . Tonsillectomy     reports that she has been smoking Cigarettes.  She has never used smokeless tobacco. She reports that she drinks alcohol. She reports that she does not use illicit drugs. family history includes  Breast cancer in her mother; Colitis in her father; Diabetes in her mother; Hyperlipidemia in her mother; Hypertension in her mother; Melanoma in her son; Testicular cancer in her son; and Ulcers in her father. Allergies  Allergen Reactions  . Atropine   . Codeine   . Ketorolac Tromethamine   . Meperidine Hcl   . Simvastatin Nausea Only    Achey        Review of Systems:Positive for nausea. Negative for dysphagia or odynophagia. Positive for weight loss. Denies shortness of breath or cough   The remainder of the 10  point ROS is negative except as outlined in H&P   Physical Exam: General appearance  Well developed, in no distress,suntanned, appears anxious. Eyes- non icteric. HEENT nontraumatic, normocephalic. Mouth no lesions, tongue papillated, no cheilosis. Neck supple without adenopathy, thyroid not enlarged, no carotid bruits, no JVD. Lungs Clear to auscultation bilaterally. Cor normal S1 normal S2, regular rhythm , no murmur,  quiet precordium. Abdomen hyperactive bowel sounds. Diffuse tenderness more so in the right lower quadrant. No tympany. Liver edge at costal margin. No scars.  Rectal: normal rectal sphincter tone. Small amount of Hemoccult negative stool.  Extremities no pedal edema. Skin no lesions. Neurological alert and oriented x 3. Psychological normal mood and affect.  Assessment and Plan:  Problem #1 chronic diarrhea with associated weight loss and malabsorption. Patient also had collagenous colitis diagnosed 6 years ago. She will  need to be reevaluated to confirm the diagnosis at this time. No treatment has been applied so far except for incidental use of antibiotics which seems to have helped. Her serum albumin has been normal at 4.6 suggesting a normal nutritional status. She is a rather anxious individual and some of her symptoms may be related to irritable bowel syndrome. We will proceed with an upper endoscopy and colonoscopy using propofol with small  bowel biopsies and random biopsies of the colon. We will try Cipro 500 mg daily. I have discussed the possibility of a small bowel follow  through. I have also discussed Entocort immunomodulators. The treatment will really depend on what the biopsies show.   11/08/2010 Lina Sar

## 2010-11-08 NOTE — Patient Instructions (Signed)
You have been scheduled for a colonoscopy. Please follow written instructions given to you at your visit today.  Please pick up your Moviprep kit at the pharmacy within the next 2-3 days. We have sent a prescription to your pharmacy for Cipro. You should take 1 tablet by mouth once daily. CC: Wendy Houston

## 2010-11-23 ENCOUNTER — Ambulatory Visit (AMBULATORY_SURGERY_CENTER): Payer: BC Managed Care – PPO | Admitting: Internal Medicine

## 2010-11-23 ENCOUNTER — Encounter: Payer: Self-pay | Admitting: Internal Medicine

## 2010-11-23 ENCOUNTER — Telehealth: Payer: Self-pay | Admitting: Internal Medicine

## 2010-11-23 DIAGNOSIS — R933 Abnormal findings on diagnostic imaging of other parts of digestive tract: Secondary | ICD-10-CM

## 2010-11-23 DIAGNOSIS — K909 Intestinal malabsorption, unspecified: Secondary | ICD-10-CM

## 2010-11-23 DIAGNOSIS — R197 Diarrhea, unspecified: Secondary | ICD-10-CM

## 2010-11-23 DIAGNOSIS — K5289 Other specified noninfective gastroenteritis and colitis: Secondary | ICD-10-CM

## 2010-11-23 DIAGNOSIS — K2990 Gastroduodenitis, unspecified, without bleeding: Secondary | ICD-10-CM

## 2010-11-23 DIAGNOSIS — K2270 Barrett's esophagus without dysplasia: Secondary | ICD-10-CM

## 2010-11-23 DIAGNOSIS — K2970 Gastritis, unspecified, without bleeding: Secondary | ICD-10-CM

## 2010-11-23 DIAGNOSIS — R634 Abnormal weight loss: Secondary | ICD-10-CM

## 2010-11-23 MED ORDER — SODIUM CHLORIDE 0.9 % IV SOLN: 500.0000 mL | INTRAVENOUS | Status: DC

## 2010-11-23 NOTE — Progress Notes (Signed)
Pressure applied to the abdomen to reach cecum. 

## 2010-11-23 NOTE — Telephone Encounter (Signed)
Forwarded to Dr. Brodie for review. °

## 2010-11-23 NOTE — Patient Instructions (Signed)
Discharge instructions given with verbal understanding. Resume previous medications.  

## 2010-11-25 ENCOUNTER — Telehealth: Payer: Self-pay | Admitting: *Deleted

## 2010-11-25 NOTE — Telephone Encounter (Signed)
Attempted to call patient times 5, telephone busy.

## 2010-11-29 ENCOUNTER — Telehealth: Payer: Self-pay | Admitting: Internal Medicine

## 2010-11-29 NOTE — Telephone Encounter (Signed)
Advised patient that pathology has not yet come back .Advised her that we will send her a letter with results as soon as they become available.

## 2010-11-30 ENCOUNTER — Encounter: Payer: Self-pay | Admitting: Family Medicine

## 2010-11-30 DIAGNOSIS — K2270 Barrett's esophagus without dysplasia: Secondary | ICD-10-CM | POA: Insufficient documentation

## 2010-12-01 ENCOUNTER — Telehealth: Payer: Self-pay | Admitting: Internal Medicine

## 2010-12-01 ENCOUNTER — Encounter: Payer: Self-pay | Admitting: Internal Medicine

## 2010-12-01 NOTE — Telephone Encounter (Signed)
Please call pt with results. I have sent the letters. EGD showed Barrett's esophagus. Colonoscopy showed microscopic colitis. She needs to be seen in next 2 weeks to discuss treatment of both conditions.

## 2010-12-01 NOTE — Telephone Encounter (Signed)
Patient will be going out of town and would like to get the results of biopsy from 11/23/10.

## 2010-12-02 ENCOUNTER — Encounter: Payer: Self-pay | Admitting: *Deleted

## 2010-12-02 ENCOUNTER — Telehealth: Payer: Self-pay | Admitting: *Deleted

## 2010-12-02 ENCOUNTER — Encounter: Payer: Self-pay | Admitting: Internal Medicine

## 2010-12-02 MED ORDER — BUDESONIDE 3 MG PO CP24
ORAL_CAPSULE | ORAL | Status: DC
Start: 2010-12-02 — End: 2010-12-07

## 2010-12-02 NOTE — Telephone Encounter (Signed)
Spoke with patient and gave her Dr. Regino Schultze recommendations. Patient is going out of town and had to schedule OV after this on 12/24/10 at 2:15 PM. Letter mailed.

## 2010-12-02 NOTE — Telephone Encounter (Signed)
Patient notified of Dr. Regino Schultze recommendation. Rx sent to pharmacy. Patient already has f/u ov.

## 2010-12-02 NOTE — Telephone Encounter (Signed)
Message copied by Daphine Deutscher on Thu Dec 02, 2010  2:55 PM ------      Message from: Hart Carwin      Created: Thu Dec 02, 2010  1:28 PM       Letter sent with collagenous colitis and Barrett's esophagus. Please start pt on Entecort 3mg , #90, 3 po qd x 4 weeks, 2 po qd x 4 weeks, then 1 po qd x 4 weeks. OV 6-8 weeks

## 2010-12-06 ENCOUNTER — Telehealth: Payer: Self-pay | Admitting: *Deleted

## 2010-12-06 NOTE — Telephone Encounter (Signed)
If she cannot afford Entecort, please send Prednisone 5 mg, #100, 4 po qd x 10 days, 3 po qd x 10 days, 2 po qd x 10 days , 1 po qd x 10 days, then call with status update,

## 2010-12-06 NOTE — Telephone Encounter (Signed)
Patient's pharmacy states that Entocort rx will cost patient $1,120.51 per month even with insurance coverage. Would you like to try her on something else?

## 2010-12-07 MED ORDER — PREDNISONE 5 MG PO TABS: ORAL_TABLET | ORAL | Status: DC

## 2010-12-07 NOTE — Telephone Encounter (Signed)
I have spoken with patient to advise her that since the entocort is so expensive, we will try her on prednisone instead. I have given her the taper instructions and have asked that she call back with an update towards the end of the taper (or sooner if not improving). Patient verbalizes understanding and states that she has an appointment with Dr Juanda Chance on 12/24/10 that she will keep.

## 2010-12-07 NOTE — Telephone Encounter (Signed)
Addended by: Richardson Chiquito on: 12/07/2010 08:10 AM   Modules accepted: Orders

## 2010-12-15 ENCOUNTER — Telehealth: Payer: Self-pay | Admitting: Internal Medicine

## 2010-12-16 ENCOUNTER — Ambulatory Visit: Payer: BC Managed Care – PPO | Admitting: Internal Medicine

## 2010-12-16 NOTE — Telephone Encounter (Signed)
Left a message for patient to call me. 

## 2010-12-17 NOTE — Telephone Encounter (Signed)
Left message again.

## 2010-12-21 NOTE — Telephone Encounter (Signed)
Left message again.

## 2010-12-24 ENCOUNTER — Ambulatory Visit (INDEPENDENT_AMBULATORY_CARE_PROVIDER_SITE_OTHER): Payer: BC Managed Care – PPO | Admitting: Internal Medicine

## 2010-12-24 ENCOUNTER — Encounter: Payer: Self-pay | Admitting: Internal Medicine

## 2010-12-24 VITALS — BP 128/74 | HR 80 | Ht 60.0 in | Wt 105.0 lb

## 2010-12-24 DIAGNOSIS — K5289 Other specified noninfective gastroenteritis and colitis: Secondary | ICD-10-CM

## 2010-12-24 DIAGNOSIS — K2270 Barrett's esophagus without dysplasia: Secondary | ICD-10-CM

## 2010-12-24 MED ORDER — OMEPRAZOLE 20 MG PO CPDR: 20.0000 mg | DELAYED_RELEASE_CAPSULE | Freq: Two times a day (BID) | ORAL | Status: DC

## 2010-12-24 MED ORDER — OMEPRAZOLE 20 MG PO CPDR: 20.0000 mg | DELAYED_RELEASE_CAPSULE | Freq: Every day | ORAL | Status: DC

## 2010-12-24 MED ORDER — CIPROFLOXACIN HCL 250 MG PO TABS: 250.0000 mg | ORAL_TABLET | Freq: Two times a day (BID) | ORAL | Status: AC

## 2010-12-24 NOTE — Patient Instructions (Addendum)
We have sent the following medications to your pharmacy for you to pick up at your convenience: Cipro 250 mg. Take 1 tablet by mouth twice daily. Prilosec (omeprazole) 20 mg. Take 1 tablet by mouth once daily You will be due for a recall endoscopy in 2 years. We will send you a reminder in the mail when it gets closer to that time.  OV 6 months

## 2010-12-24 NOTE — Progress Notes (Signed)
Wendy Houston 27-Aug-1950 MRN [id number]     History of Present Illness:  This is a 60 year old, [place] female with a new diagnosis of Barrett's esophagus and collagenous colitis. She was put on Cipro 500 mg a day with complete resolution of her diarrhea. She has been on the medication now for several weeks and is having only 1-2 soft bowel movements a day. She still has bloating and abdominal distention. She has a history of chronic gastroesophageal reflux. She denies dysphagia or odynophagia. She did not refill her prescription for prednisone or Entocort because the antibiotics helped before she could start steroids. She feels back to normal.   Past Medical History  Diagnosis Date  . Hyperlipidemia   . Leukocytosis     felt to be secondary to collagenous colitis; sees Dr Waymon Amato.  . Anxiety   . Collagenous colitis   . Hypertension   . Diverticulosis   . Anemia   . Other abnormal clinical finding 2009    rapid gastric emptying  . Pancreatic insufficiency 02/20/08    as per stool elastase results  . Dumping syndrome   . Lymph edema     around liver  . Arthritis   . Depression   . 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 Gastritis, acute    Past Surgical History  Procedure Date  . Tubal ligation   . Tonsillectomy     reports that she has been smoking Cigarettes.  She has never used smokeless tobacco. She reports that she drinks alcohol. She reports that she does not use illicit drugs. family history includes Breast cancer in her mother; Colitis in her father; Diabetes in her mother; Hyperlipidemia in her mother; Hypertension in her mother; Melanoma in her son; Testicular cancer in her son; and Ulcers in her father. Allergies  Allergen Reactions  . Atropine   . Codeine   . Ketorolac Tromethamine   . Meperidine Hcl   . Simvastatin Nausea Only    Achey        Review of Systems: Denies shortness of breath, chest pain or rectal bleeding  The remainder of  the 10  point ROS is negative except as outlined in H&P   Physical Exam: General appearance  Well developed, in no distress. Eyes- non icteric. HEENT nontraumatic, normocephalic. Mouth no lesions, tongue papillated, no cheilosis. Neck supple without adenopathy, thyroid not enlarged, no carotid bruits, no JVD. Lungs Clear to auscultation bilaterally. Cor normal S1 normal S2, regular rhythm , no murmur,  quiet precordium. Abdomen soft abdomen with hyperactive bowel sounds. Minimal tenderness right lower quadrant. Increased tympany. Rectal: Not done. Extremities no pedal edema. Skin no lesions. Neurological alert and oriented x 3. Psychological normal mood and affect.  Assessment and Plan:  Problem #1 collagenous colitis. Patient should continue Cipro 500 mg a day for 2 more weeks then reduce to 250 mg a day. If the Cipro stops working, then we will restart a short course of prednisone starting at 20 mg a day tapering down by 5 mg every 2 weeks.  Problem #2 Barrett's esophagus. Patient will begin Prilosec 20 mg daily. She needs to stop smoking and follow antireflux measures. A recall upper endoscopy will be due in 2 years.    12/24/2010 Lina Sar

## 2011-01-18 ENCOUNTER — Telehealth: Payer: Self-pay | Admitting: Internal Medicine

## 2011-01-18 NOTE — Telephone Encounter (Signed)
Zofran 4 mg, #20, 1 po q 8 hrs prn nausea, 1 REFILL,

## 2011-01-18 NOTE — Telephone Encounter (Signed)
Patient calling to report she is doing better overall except she is still having intermittent nausea and does not have an appetite. She states she has had nausea for 7 years and has tried Phenergan but it does not help. Wants to know if she can try something else.  Hx Barrett's eso, collangenous colitis, chronic gastroesophageal reflux.Please, advise.

## 2011-01-19 MED ORDER — ONDANSETRON HCL 4 MG PO TABS: ORAL_TABLET | ORAL | Status: DC

## 2011-01-19 NOTE — Telephone Encounter (Signed)
Spoke with patient and gave her Dr. Brodie's recommendation. Rx sent. 

## 2011-01-28 ENCOUNTER — Other Ambulatory Visit: Payer: Self-pay | Admitting: *Deleted

## 2011-01-28 MED ORDER — ALPRAZOLAM 1 MG PO TABS: 1.0000 mg | ORAL_TABLET | Freq: Three times a day (TID) | ORAL | Status: DC | PRN

## 2011-02-25 ENCOUNTER — Other Ambulatory Visit: Payer: Self-pay | Admitting: Internal Medicine

## 2011-02-25 ENCOUNTER — Other Ambulatory Visit: Payer: Self-pay | Admitting: Family Medicine

## 2011-02-26 ENCOUNTER — Other Ambulatory Visit: Payer: Self-pay | Admitting: Internal Medicine

## 2011-03-28 ENCOUNTER — Other Ambulatory Visit: Payer: Self-pay | Admitting: *Deleted

## 2011-03-29 ENCOUNTER — Other Ambulatory Visit: Payer: Self-pay | Admitting: *Deleted

## 2011-03-29 ENCOUNTER — Other Ambulatory Visit: Payer: Self-pay | Admitting: Family Medicine

## 2011-03-29 DIAGNOSIS — Z1231 Encounter for screening mammogram for malignant neoplasm of breast: Secondary | ICD-10-CM

## 2011-03-29 MED ORDER — ALPRAZOLAM 1 MG PO TABS: 1.0000 mg | ORAL_TABLET | Freq: Three times a day (TID) | ORAL | Status: DC | PRN

## 2011-03-29 NOTE — Telephone Encounter (Signed)
error 

## 2011-04-07 ENCOUNTER — Encounter: Payer: Self-pay | Admitting: Family Medicine

## 2011-04-07 ENCOUNTER — Ambulatory Visit (INDEPENDENT_AMBULATORY_CARE_PROVIDER_SITE_OTHER): Payer: BC Managed Care – PPO | Admitting: Family Medicine

## 2011-04-07 DIAGNOSIS — R609 Edema, unspecified: Secondary | ICD-10-CM

## 2011-04-07 DIAGNOSIS — F329 Major depressive disorder, single episode, unspecified: Secondary | ICD-10-CM

## 2011-04-07 MED ORDER — SERTRALINE HCL 100 MG PO TABS: 150.0000 mg | ORAL_TABLET | Freq: Every day | ORAL | Status: DC

## 2011-04-07 MED ORDER — FUROSEMIDE 20 MG PO TABS: 20.0000 mg | ORAL_TABLET | Freq: Every day | ORAL | Status: DC | PRN

## 2011-04-07 MED ORDER — QUETIAPINE FUMARATE 50 MG PO TABS: 50.0000 mg | ORAL_TABLET | Freq: Every day | ORAL | Status: AC

## 2011-04-07 NOTE — Progress Notes (Signed)
  Subjective:    Patient ID: Wendy Houston, female    DOB: 1950/11/01, 60 y.o.   MRN: [id number]  HPI She is here to f/u on anxiety.  She has been home for A month and barely gotten out of the house because of her GI problems.  Says this has been difficult.Not sleeping well.  Says goes to bedt around 9PM.  Wakes up around 2 AM and then will usually not fall alseep again until 5AM. Then will sleep for maybe another hour. Says she is very tearful.  Sys she just wants to feel like her old self. She sometimes feels her life is over. She is not suicidal. Has flt like the last few years.  She feels lonely.  No regular exercise.   C/O of intermittant swelling of hands and the bags under her eyes. Says some days better than others. No known triggers.    Review of Systems     Objective:   Physical Exam  Constitutional: She is oriented to person, place, and time. She appears well-developed and well-nourished.  HENT:  Head: Normocephalic and atraumatic.  Cardiovascular: Normal rate, regular rhythm and normal heart sounds.   Pulmonary/Chest: Effort normal and breath sounds normal.  Neurological: She is alert and oriented to person, place, and time.  Skin: Skin is warm and dry.  Psychiatric: She has a normal mood and affect. Her behavior is normal.          Assessment & Plan:  Moderate Depression - GAD-7 score of 11.  Will dec her sertraline and add seroquel. F.U in 3 weeks. She declined counseling at this time but says she will think about it. She says she has done this in the past  Edema- Will start prn lasix. Warned can affect renal and potass levels. Recheck BMP  level in 3 weeks. Use sparingly. Warned about hypotension as well. Dec salt intake.

## 2011-04-11 ENCOUNTER — Telehealth: Payer: Self-pay | Admitting: Family Medicine

## 2011-04-11 NOTE — Telephone Encounter (Signed)
Please call patient. Normal mammogram.  Repeat in 1 year.  

## 2011-04-12 ENCOUNTER — Ambulatory Visit: Payer: BC Managed Care – PPO

## 2011-04-22 NOTE — Telephone Encounter (Signed)
No answer and no machine

## 2011-04-27 ENCOUNTER — Other Ambulatory Visit: Payer: Self-pay | Admitting: *Deleted

## 2011-04-27 MED ORDER — ALPRAZOLAM 1 MG PO TABS: 1.0000 mg | ORAL_TABLET | Freq: Three times a day (TID) | ORAL | Status: DC | PRN

## 2011-04-27 NOTE — Telephone Encounter (Signed)
Pt notified. KJ LPN 

## 2011-04-28 ENCOUNTER — Ambulatory Visit: Payer: BC Managed Care – PPO | Admitting: Family Medicine

## 2011-04-29 ENCOUNTER — Ambulatory Visit: Payer: BC Managed Care – PPO | Admitting: Internal Medicine

## 2011-05-05 ENCOUNTER — Ambulatory Visit: Payer: BC Managed Care – PPO | Admitting: Family Medicine

## 2011-05-18 ENCOUNTER — Ambulatory Visit: Payer: BC Managed Care – PPO | Admitting: Internal Medicine

## 2011-05-21 ENCOUNTER — Other Ambulatory Visit: Payer: Self-pay | Admitting: Family Medicine

## 2011-05-23 ENCOUNTER — Other Ambulatory Visit: Payer: Self-pay | Admitting: *Deleted

## 2011-05-25 ENCOUNTER — Telehealth: Payer: Self-pay | Admitting: Family Medicine

## 2011-05-25 NOTE — Telephone Encounter (Signed)
Patient called left a voice mail that she needs a refill and her pharmacy sent over a request but hasnt heard anything from our office

## 2011-05-27 ENCOUNTER — Other Ambulatory Visit: Payer: Self-pay | Admitting: *Deleted

## 2011-05-27 ENCOUNTER — Telehealth: Payer: Self-pay | Admitting: *Deleted

## 2011-05-27 MED ORDER — ALPRAZOLAM 1 MG PO TABS: 1.0000 mg | ORAL_TABLET | Freq: Three times a day (TID) | ORAL | Status: DC | PRN

## 2011-06-20 ENCOUNTER — Other Ambulatory Visit: Payer: Self-pay | Admitting: Family Medicine

## 2011-07-19 ENCOUNTER — Other Ambulatory Visit: Payer: Self-pay | Admitting: *Deleted

## 2011-07-19 MED ORDER — ALPRAZOLAM 1 MG PO TABS: 1.0000 mg | ORAL_TABLET | Freq: Three times a day (TID) | ORAL | Status: DC | PRN

## 2011-07-20 ENCOUNTER — Telehealth: Payer: Self-pay | Admitting: Internal Medicine

## 2011-07-20 MED ORDER — ONDANSETRON HCL 4 MG PO TABS: ORAL_TABLET | ORAL | Status: DC

## 2011-07-20 NOTE — Telephone Encounter (Signed)
OK to send Zofran 4mg , #30, 1 po q 6-8 hours prn nausea

## 2011-07-20 NOTE — Telephone Encounter (Signed)
Patient calling to report she is having nausea again that is not helped with Dicyclomine. Explained to patient that this is not for nausea. She has in computer that she had an rx for Zofran but patient does not have any Zofran at this time. May I send her Zofran? Also, noted that patient was due for 6 month f/u with Dr. Juanda Chance. Scheduled patient on 08/05/11 at 3:00 PM.

## 2011-07-20 NOTE — Telephone Encounter (Signed)
Patient advised.

## 2011-07-26 ENCOUNTER — Telehealth: Payer: Self-pay | Admitting: Internal Medicine

## 2011-07-26 NOTE — Telephone Encounter (Signed)
OK to send Phenergan 12.5 mg, #20, 1 po q 4--6 hrs prn, 1 refill

## 2011-07-26 NOTE — Telephone Encounter (Signed)
Spoke with patient and she states the Zofran makes her "skin crawl". She is asking for rx for Phenergan for nausea. Is this okay?

## 2011-07-26 NOTE — Telephone Encounter (Signed)
Unable to reach patient. Phones rings but no answer or voice mail

## 2011-07-27 MED ORDER — PROMETHAZINE HCL 12.5 MG PO TABS: ORAL_TABLET | ORAL | Status: DC

## 2011-07-27 NOTE — Telephone Encounter (Signed)
Rx sent to pharmacy. Patient line is busy will try again later.

## 2011-07-27 NOTE — Telephone Encounter (Signed)
Spoke with patient and told her rx has been sent to pharmacy. Offered to work patient with extender for her nausea but she declined to schedule.

## 2011-08-05 ENCOUNTER — Ambulatory Visit: Payer: BC Managed Care – PPO | Admitting: Internal Medicine

## 2011-08-12 ENCOUNTER — Telehealth: Payer: Self-pay | Admitting: *Deleted

## 2011-08-12 NOTE — Telephone Encounter (Signed)
Pt called stating that Dr Donnald Garre had seen the patient over a year ago and she wanted to refer her to an MD at [facility] and she wanted to know which MD he recommended.  Referred back to Dr [facility] dictation from 06/2010 there was no MD specified that Dr Donnald Garre recommended from [facility].  Called and left a message to call us back to clarify.  SLJ

## 2011-08-15 ENCOUNTER — Telehealth: Payer: Self-pay | Admitting: *Deleted

## 2011-08-15 ENCOUNTER — Other Ambulatory Visit: Payer: Self-pay | Admitting: *Deleted

## 2011-08-15 ENCOUNTER — Ambulatory Visit: Payer: BC Managed Care – PPO | Admitting: Family Medicine

## 2011-08-15 DIAGNOSIS — D72829 Elevated white blood cell count, unspecified: Secondary | ICD-10-CM

## 2011-08-15 DIAGNOSIS — E785 Hyperlipidemia, unspecified: Secondary | ICD-10-CM

## 2011-08-15 DIAGNOSIS — R5383 Other fatigue: Secondary | ICD-10-CM

## 2011-08-15 MED ORDER — ALPRAZOLAM 1 MG PO TABS: 1.0000 mg | ORAL_TABLET | Freq: Three times a day (TID) | ORAL | Status: DC | PRN

## 2011-08-15 NOTE — Telephone Encounter (Signed)
Labs entered. KJ LPN 

## 2011-08-17 LAB — CBC
MCH: 31.5 pg (ref 26.0–34.0)
MCHC: 33.3 g/dL (ref 30.0–36.0)
MCV: 94.6 fL (ref 78.0–100.0)
Platelets: 286 10*3/uL (ref 150–400)
RBC: 4.67 MIL/uL (ref 3.87–5.11)

## 2011-08-17 LAB — LIPID PANEL
Cholesterol: 158 mg/dL (ref 0–200)
HDL: 45 mg/dL (ref >39)
Total CHOL/HDL Ratio: 3.5 Ratio
Triglycerides: 161 mg/dL — ABNORMAL HIGH (ref <150)

## 2011-08-17 LAB — HEPATIC FUNCTION PANEL
ALT: 11 U/L (ref 0–35)
AST: 17 U/L (ref 0–37)
Albumin: 4.5 g/dL (ref 3.5–5.2)

## 2011-08-17 LAB — VITAMIN B12: Vitamin B-12: 1559 pg/mL — ABNORMAL HIGH (ref 211–911)

## 2011-08-18 ENCOUNTER — Encounter: Payer: Self-pay | Admitting: Family Medicine

## 2011-08-18 DIAGNOSIS — E559 Vitamin D deficiency, unspecified: Secondary | ICD-10-CM | POA: Insufficient documentation

## 2011-09-06 ENCOUNTER — Ambulatory Visit: Payer: BC Managed Care – PPO | Admitting: Internal Medicine

## 2011-09-08 ENCOUNTER — Telehealth: Payer: Self-pay | Admitting: Internal Medicine

## 2011-09-08 NOTE — Telephone Encounter (Signed)
Message copied by Arna Snipe on Thu Sep 08, 2011  9:18 AM ------      Message from: Richardson Chiquito      Created: Tue Sep 06, 2011  1:11 PM      Regarding: FW: late cancellation fee                   ----- Message -----         From: Hart Carwin, MD         Sent: 09/06/2011  12:48 PM           To: Richardson Chiquito, CMA      Subject: late cancellation fee                                    Yes, DB      ----- Message -----         From: Richardson Chiquito, CMA         Sent: 09/06/2011   8:51 AM           To: Hart Carwin, MD             Dr Juanda Chance-      Do you want to charge patient late cancellation fee?

## 2011-09-13 ENCOUNTER — Other Ambulatory Visit: Payer: Self-pay | Admitting: *Deleted

## 2011-09-13 ENCOUNTER — Other Ambulatory Visit: Payer: Self-pay | Admitting: Internal Medicine

## 2011-09-13 MED ORDER — ALPRAZOLAM 1 MG PO TABS: 1.0000 mg | ORAL_TABLET | Freq: Three times a day (TID) | ORAL | Status: DC | PRN

## 2011-09-19 ENCOUNTER — Ambulatory Visit (INDEPENDENT_AMBULATORY_CARE_PROVIDER_SITE_OTHER): Payer: BC Managed Care – PPO | Admitting: Family Medicine

## 2011-09-19 ENCOUNTER — Encounter: Payer: Self-pay | Admitting: Family Medicine

## 2011-09-19 VITALS — BP 123/76 | HR 79 | Ht 65.0 in | Wt 110.0 lb

## 2011-09-19 DIAGNOSIS — F329 Major depressive disorder, single episode, unspecified: Secondary | ICD-10-CM

## 2011-09-19 DIAGNOSIS — Z23 Encounter for immunization: Secondary | ICD-10-CM

## 2011-09-19 DIAGNOSIS — F3289 Other specified depressive episodes: Secondary | ICD-10-CM

## 2011-09-19 DIAGNOSIS — E785 Hyperlipidemia, unspecified: Secondary | ICD-10-CM

## 2011-09-19 DIAGNOSIS — E559 Vitamin D deficiency, unspecified: Secondary | ICD-10-CM

## 2011-09-19 MED ORDER — FUROSEMIDE 20 MG PO TABS: 20.0000 mg | ORAL_TABLET | Freq: Every day | ORAL | Status: DC | PRN

## 2011-09-19 MED ORDER — ATORVASTATIN CALCIUM 40 MG PO TABS: 40.0000 mg | ORAL_TABLET | Freq: Every day | ORAL | Status: DC

## 2011-09-19 MED ORDER — SERTRALINE HCL 100 MG PO TABS: 200.0000 mg | ORAL_TABLET | Freq: Every day | ORAL | Status: DC

## 2011-09-19 MED ORDER — ALPRAZOLAM 1 MG PO TABS: 1.0000 mg | ORAL_TABLET | Freq: Three times a day (TID) | ORAL | Status: DC | PRN

## 2011-09-19 NOTE — Progress Notes (Signed)
  Subjective:    Patient ID: Wendy Houston, female    DOB: May 01, 1951, 61 y.o.   MRN: [id number]  HPI Depressoin - she is still struggling. She is still taking the sertraline but inc it to two tabs on her own. She has done that before and did well with that. She does feel it helps. She admits she's been lying in the bed for him as the last 2 months. She's gained a few pounds since then as well. She does plan on going to the beach with her grandson in the next week and is really looking forward to that. She is very excited about it. She says that is where she would prefer to live. [facility] [place] [facility] counseling. She actually felt it was very helpful. They also do yoga etc. There.  Hyperlipidemia-she is tolerating her Lipitor well. She does need a refill sent over to her pharmacy.   Review of Systems     Objective:   Physical Exam  Constitutional: She is oriented to person, place, and time. She appears well-developed and well-nourished.  HENT:  Head: Normocephalic and atraumatic.  Cardiovascular: Normal rate, regular rhythm and normal heart sounds.   Pulmonary/Chest: Effort normal and breath sounds normal.  Neurological: She is alert and oriented to person, place, and time.  Skin: Skin is warm and dry.  Psychiatric: She has a normal mood and affect. Her behavior is normal.          Assessment & Plan:  Depression - We dicussed light therapy. I really think this would help her.   She has started counseling and feels this has been a positive impact as well. I did discuss with her that her PHQ 9 score is still 18 which means she is not even close to being therapeutic as far as her regimen. She says for now she really wants to work on the counseling and may be consider the light therapy and keep her sertraline the way it is. She is not currently interested in not on therapy with a mood stabilizer. I discussed with her that I would like to see  her back in 3 months and depending on how she's doing that we need to seriously consider adjusting her medication regimen. Refill for medications were sent to her pharmacy.  Vita D def - Started a supplement about a month ago.  We will recheck her level at her followup in 3 months.  Hyperlipidemia- we discussed her total cholesterol LDL were at goal last month. Which is seen to work on getting her triglycerides down a little. I think if she were able to increase her activity level she would be completely at goal. She certainly could consider fish oil as well. Recheck triglyceride level at her followup in 3 months. Lab Results  Component Value Date   CHOL 158 08/17/2011   HDL 45 08/17/2011   LDLCALC 81 08/17/2011   TRIG 161* 08/17/2011   CHOLHDL 3.5 08/17/2011

## 2011-09-19 NOTE — Patient Instructions (Signed)
Discussed trying to do light therapy.

## 2011-09-21 ENCOUNTER — Other Ambulatory Visit: Payer: Self-pay | Admitting: *Deleted

## 2011-09-21 MED ORDER — ALPRAZOLAM 1 MG PO TABS: 1.0000 mg | ORAL_TABLET | Freq: Three times a day (TID) | ORAL | Status: DC | PRN

## 2011-10-07 ENCOUNTER — Ambulatory Visit: Payer: BC Managed Care – PPO | Admitting: Internal Medicine

## 2011-10-10 ENCOUNTER — Other Ambulatory Visit: Payer: Self-pay | Admitting: Family Medicine

## 2011-10-12 ENCOUNTER — Other Ambulatory Visit: Payer: Self-pay | Admitting: *Deleted

## 2011-10-12 MED ORDER — SERTRALINE HCL 100 MG PO TABS: 200.0000 mg | ORAL_TABLET | Freq: Every day | ORAL | Status: DC

## 2011-10-12 MED ORDER — ATORVASTATIN CALCIUM 40 MG PO TABS: 40.0000 mg | ORAL_TABLET | Freq: Every day | ORAL | Status: DC

## 2011-10-31 ENCOUNTER — Other Ambulatory Visit: Payer: Self-pay | Admitting: *Deleted

## 2011-10-31 MED ORDER — ALPRAZOLAM 1 MG PO TABS: 1.0000 mg | ORAL_TABLET | Freq: Three times a day (TID) | ORAL | Status: DC | PRN

## 2011-11-30 ENCOUNTER — Other Ambulatory Visit: Payer: Self-pay | Admitting: *Deleted

## 2011-11-30 MED ORDER — ALPRAZOLAM 1 MG PO TABS: 1.0000 mg | ORAL_TABLET | Freq: Three times a day (TID) | ORAL | Status: DC | PRN

## 2011-12-28 ENCOUNTER — Other Ambulatory Visit: Payer: Self-pay | Admitting: Family Medicine

## 2011-12-28 NOTE — Telephone Encounter (Signed)
Tried to call pt to see why she takes med but no answer and was unable to LM.

## 2011-12-28 NOTE — Telephone Encounter (Signed)
I know we did this for her last year. He just verify why she takes it. Am not exactly clear when I try to look back through the medical record.

## 2011-12-29 ENCOUNTER — Telehealth: Payer: Self-pay | Admitting: *Deleted

## 2011-12-29 NOTE — Telephone Encounter (Signed)
Unable to LM for pt to return call

## 2011-12-29 NOTE — Telephone Encounter (Signed)
Pt had request sent to [place] from her pharmacy requesting Cipro to be filled. Dr Linford Arnold ask me to call and ask pt why she takes med. I have been unable to contact pt multiple times.

## 2012-01-02 ENCOUNTER — Other Ambulatory Visit: Payer: Self-pay | Admitting: *Deleted

## 2012-01-02 MED ORDER — ALPRAZOLAM 1 MG PO TABS: 1.0000 mg | ORAL_TABLET | Freq: Three times a day (TID) | ORAL | Status: DC | PRN

## 2012-01-28 ENCOUNTER — Other Ambulatory Visit: Payer: Self-pay | Admitting: Family Medicine

## 2012-02-02 ENCOUNTER — Telehealth: Payer: Self-pay | Admitting: Family Medicine

## 2012-02-02 MED ORDER — ALPRAZOLAM 1 MG PO TABS: 1.0000 mg | ORAL_TABLET | Freq: Three times a day (TID) | ORAL | Status: DC | PRN

## 2012-02-02 NOTE — Telephone Encounter (Signed)
Patient called upset that she always has a hard time when she tries to get her prescription for Xanax with our office sending her script to [facility] in [place]. Patient states that if she calls in advance and our office wont send to the pharmacy in advance and have to call 10 times per day and then once she waits to refill Xanax they take 48 hrs and she said its starting to be an hassle and she is not happy. Pt needs a refill and request a call back if she needs an appointment before refill. Thanks

## 2012-02-02 NOTE — Telephone Encounter (Signed)
Please let her know we will fax the rx today. I don't know hwy having problems.  She is also due to f/u. She was supposed to f/u in July.

## 2012-02-02 NOTE — Telephone Encounter (Signed)
Xanax called in to pharmacy and pt informed.

## 2012-02-27 ENCOUNTER — Ambulatory Visit: Payer: BC Managed Care – PPO | Admitting: Internal Medicine

## 2012-02-29 ENCOUNTER — Telehealth: Payer: Self-pay | Admitting: *Deleted

## 2012-02-29 MED ORDER — ALPRAZOLAM 1 MG PO TABS: 1.0000 mg | ORAL_TABLET | Freq: Three times a day (TID) | ORAL | Status: DC | PRN

## 2012-02-29 MED ORDER — DICYCLOMINE HCL 20 MG PO TABS: 20.0000 mg | ORAL_TABLET | Freq: Three times a day (TID) | ORAL | Status: DC

## 2012-02-29 NOTE — Telephone Encounter (Signed)
Called patient's home several times to tell rx was sent to pharm-her fax machine kicks on each time.

## 2012-02-29 NOTE — Telephone Encounter (Signed)
Patient requesting med refill on all meds and pharm changed per her request to change to [facility] You stated in your last note that she needs a f/u visit.  She said she is going out of town and will f/u when she comes back  If you refill her meds this time.

## 2012-02-29 NOTE — Telephone Encounter (Signed)
Okay to refill her Xanax for 2 weeks. She was told a month ago that she needed to followup within that month timeframe and she did not. I understand she's going out of town so we will give her 2 weeks worth. As far as her until we can refill for a month. As far as results is concerned she really should have enough until September so not sure why she is needing a refill on that at this point in time.

## 2012-03-02 ENCOUNTER — Other Ambulatory Visit: Payer: Self-pay | Admitting: *Deleted

## 2012-03-02 MED ORDER — SERTRALINE HCL 100 MG PO TABS: 100.0000 mg | ORAL_TABLET | Freq: Two times a day (BID) | ORAL | Status: DC

## 2012-03-20 ENCOUNTER — Ambulatory Visit: Payer: BC Managed Care – PPO | Admitting: Family Medicine

## 2012-03-20 DIAGNOSIS — Z0289 Encounter for other administrative examinations: Secondary | ICD-10-CM

## 2012-03-29 ENCOUNTER — Ambulatory Visit: Payer: BC Managed Care – PPO | Admitting: Internal Medicine

## 2012-11-09 ENCOUNTER — Telehealth: Payer: Self-pay | Admitting: Internal Medicine

## 2012-11-09 NOTE — Telephone Encounter (Signed)
Unable to leave a message the voicemail mailbox is full.  Patient is due to EGD only her colon in 11/2010 indicates 10 year recall.  Dr. Juanda Chance just reviewed and the recall is correct.

## 2012-11-12 NOTE — Telephone Encounter (Signed)
Mail box is full unable to reach patient. 

## 2012-11-13 NOTE — Telephone Encounter (Signed)
Mailbox is full unable to reach

## 2012-11-13 NOTE — Telephone Encounter (Signed)
Spoke with patient and gave her recall dates. She states she is having diarrhea and thinks she may need a colonoscopy with EGD. Patient will come in for OV. She wants to come at the end of July. Scheduled on 12/18/12 at 3:15 PM.

## 2012-11-16 ENCOUNTER — Encounter: Payer: Self-pay | Admitting: *Deleted

## 2012-12-13 ENCOUNTER — Encounter: Payer: Self-pay | Admitting: Internal Medicine

## 2012-12-18 ENCOUNTER — Ambulatory Visit: Payer: BC Managed Care – PPO | Admitting: Internal Medicine

## 2013-01-30 ENCOUNTER — Ambulatory Visit: Payer: BC Managed Care – PPO | Admitting: Internal Medicine

## 2013-01-30 ENCOUNTER — Telehealth: Payer: Self-pay | Admitting: Internal Medicine

## 2013-01-31 NOTE — Telephone Encounter (Signed)
Yes, late cancelation

## 2013-02-26 ENCOUNTER — Ambulatory Visit: Payer: BC Managed Care – PPO | Admitting: Internal Medicine

## 2013-09-04 ENCOUNTER — Encounter: Payer: Self-pay | Admitting: Internal Medicine

## 2013-10-11 ENCOUNTER — Ambulatory Visit: Payer: BC Managed Care – PPO | Admitting: Internal Medicine

## 2013-10-29 ENCOUNTER — Ambulatory Visit: Payer: BC Managed Care – PPO | Admitting: Internal Medicine

## 2013-10-29 ENCOUNTER — Telehealth: Payer: Self-pay | Admitting: *Deleted

## 2013-10-29 ENCOUNTER — Encounter: Payer: Self-pay | Admitting: *Deleted

## 2013-10-29 NOTE — Telephone Encounter (Signed)
Message copied by Larina Bras on Tue Oct 29, 2013  1:37 PM ------      Message from: [place] Dragon      Created: Tue Oct 29, 2013 12:28 PM      Regarding: RE: Death in 58. Unexpectedly. Cx appt today 10-29-13 and res for 12-13-13       Please send discharge letter. She canceled in April 2013, Sept 2014 and now.       ----- Message -----         From: Larina Bras, CMA         Sent: 10/29/2013  11:58 AM           To: Lafayette Dragon, MD      Subject: FW: Death in [place]. Unexpectedly. Cx appt toda#            Dr Olevia Perches, do you want to charge late cancellation fee? Patient apparently had to cancel her 1:45 pm appointment today due to a death in the family.      ----- Message -----         From: Oliva Bustard         Sent: 10/29/2013  11:49 AM           To: Larina Bras, CMA      Subject: Death in [place]. Unexpectedly. Cx appt today 6-#                         ------

## 2013-10-29 NOTE — Telephone Encounter (Signed)
Discharge letter created and given to Dr Olevia Perches.

## 2013-10-30 ENCOUNTER — Telehealth: Payer: Self-pay | Admitting: Internal Medicine

## 2013-10-30 NOTE — Telephone Encounter (Signed)
Dismissal Letter sent by Certified Mail [phone]/[id number]  Received the Return Receipt showing someone picked up the Dismissal 11/08/2013

## 2013-12-13 ENCOUNTER — Ambulatory Visit: Payer: BC Managed Care – PPO | Admitting: Internal Medicine

## 2015-01-01 ENCOUNTER — Encounter (HOSPITAL_COMMUNITY): Payer: Self-pay

## 2015-01-01 ENCOUNTER — Inpatient Hospital Stay (HOSPITAL_COMMUNITY)
Admission: EM | Admit: 2015-01-01 | Discharge: 2015-01-03 | DRG: 684 | Discharge status: Against Medical Advice | Payer: Medicare Other | Attending: Internal Medicine | Admitting: Internal Medicine

## 2015-01-01 DIAGNOSIS — N179 Acute kidney failure, unspecified: Secondary | ICD-10-CM | POA: Diagnosis not present

## 2015-01-01 DIAGNOSIS — D72829 Elevated white blood cell count, unspecified: Secondary | ICD-10-CM | POA: Diagnosis present

## 2015-01-01 DIAGNOSIS — F329 Major depressive disorder, single episode, unspecified: Secondary | ICD-10-CM | POA: Diagnosis present

## 2015-01-01 DIAGNOSIS — R109 Unspecified abdominal pain: Secondary | ICD-10-CM

## 2015-01-01 DIAGNOSIS — F17210 Nicotine dependence, cigarettes, uncomplicated: Secondary | ICD-10-CM | POA: Diagnosis present

## 2015-01-01 DIAGNOSIS — Z9119 Patient's noncompliance with other medical treatment and regimen: Secondary | ICD-10-CM | POA: Diagnosis not present

## 2015-01-01 DIAGNOSIS — F419 Anxiety disorder, unspecified: Secondary | ICD-10-CM | POA: Diagnosis present

## 2015-01-01 DIAGNOSIS — I10 Essential (primary) hypertension: Secondary | ICD-10-CM | POA: Diagnosis present

## 2015-01-01 DIAGNOSIS — Z888 Allergy status to other drugs, medicaments and biological substances status: Secondary | ICD-10-CM

## 2015-01-01 DIAGNOSIS — Z7982 Long term (current) use of aspirin: Secondary | ICD-10-CM

## 2015-01-01 LAB — CBC WITH DIFFERENTIAL/PLATELET
BASOS ABS: 0 10*3/uL (ref 0.0–0.1)
Basophils Relative: 0 % (ref 0–1)
Eosinophils Absolute: 0.1 10*3/uL (ref 0.0–0.7)
Eosinophils Relative: 1 % (ref 0–5)
HCT: 38 % (ref 36.0–46.0)
HEMOGLOBIN: 13 g/dL (ref 12.0–15.0)
LYMPHS PCT: 23 % (ref 12–46)
Lymphs Abs: 3.1 10*3/uL (ref 0.7–4.0)
MCH: 31 pg (ref 26.0–34.0)
MCHC: 34.2 g/dL (ref 30.0–36.0)
MCV: 90.5 fL (ref 78.0–100.0)
Monocytes Absolute: 1.4 10*3/uL — ABNORMAL HIGH (ref 0.1–1.0)
Monocytes Relative: 10 % (ref 3–12)
NEUTROS ABS: 8.9 10*3/uL — AB (ref 1.7–7.7)
NEUTROS PCT: 66 % (ref 43–77)
PLATELETS: 212 10*3/uL (ref 150–400)
RBC: 4.2 MIL/uL (ref 3.87–5.11)
RDW: 13.4 % (ref 11.5–15.5)
WBC: 13.6 10*3/uL — ABNORMAL HIGH (ref 4.0–10.5)

## 2015-01-01 LAB — I-STAT CG4 LACTIC ACID, ED: Lactic Acid, Venous: <0.3 mmol/L — ABNORMAL LOW (ref 0.5–2.0)

## 2015-01-01 MED ORDER — TRAMADOL HCL 50 MG PO TABS
50.0000 mg | ORAL_TABLET | Freq: Once | ORAL | Status: AC
  Administered 2015-01-02: 50 mg via ORAL
  Filled 2015-01-01: qty 1

## 2015-01-01 MED ORDER — METOCLOPRAMIDE HCL 5 MG/ML IJ SOLN
10.0000 mg | Freq: Once | INTRAMUSCULAR | Status: AC
  Administered 2015-01-02: 10 mg via INTRAVENOUS
  Filled 2015-01-01: qty 2

## 2015-01-01 MED ORDER — SODIUM CHLORIDE 0.9 % IV BOLUS (SEPSIS)
1000.0000 mL | Freq: Once | INTRAVENOUS | Status: AC
  Administered 2015-01-02: 1000 mL via INTRAVENOUS

## 2015-01-01 NOTE — ED Notes (Signed)
Pt complains of severe abdominal pain and abdominal distention for two days, no bowel movements in several days and severe back pain

## 2015-01-01 NOTE — ED Provider Notes (Addendum)
CSN: [id number]     Arrival date & time 01/01/15  2216 History  This chart was scribed for Everlene Balls, MD by Eustaquio Maize, ED Scribe. This patient was seen in room WA14/WA14 and the patient's care was started at 11:46 PM.  Chief Complaint  Patient presents with  . Abdominal Pain   The history is provided by the patient. No language interpreter was used.     HPI Comments: Wendy Houston is a 64 y.o. female with hx collagenous colitis, barrett esophagus, and diverticulitis who presents to the Emergency Department complaining of severe, sudden onset, lower abdominal pain radiating to her back x 2 days. Pt describes the pain as stabbing and cramping in sensation. She also complains of abdominal distension, nausea, vomiting, chills, and a headache. Pt mentions that for the past year she hasn't had normal bowel movements and describes it as liquid in sensation.  Pt has had symptoms like this in the past but states it has never been this severe and or radiated to her back. She usually takes Bentyl for the symptoms but mentions it has not caused any relief in the past 2 days. She has seen GI in the past and had multiple studies done including colonoscopy and endoscopy. Denies urinary issues, vaginal pain, vaginal bleeding, or any other associated symptoms. Denies hx uterine fibroids or cysts.    Past Medical History  Diagnosis Date  . Hyperlipidemia   . Leukocytosis     felt to be secondary to collagenous colitis; sees Dr Leontine Locket.  . Anxiety   . Collagenous colitis   . Hypertension   . Diverticulosis   . Anemia   . Other abnormal clinical finding 2009    rapid gastric emptying  . Pancreatic insufficiency 02/20/08    as per stool elastase results  . Dumping syndrome   . Lymph edema     around liver  . Arthritis   . Depression   . 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 Gastritis, acute   . Barrett esophagus    Past Surgical History  Procedure Laterality Date  . Tubal  ligation    . Tonsillectomy     Family History  Problem Relation Age of Onset  . Breast cancer Mother   . Diabetes Mother   . Hyperlipidemia Mother   . Hypertension Mother   . Ulcers Father   . Colitis Father   . Testicular cancer Son   . Melanoma Son     arm  . Diabetes Son   . Diabetes Maternal Grandmother    Social History  Substance Use Topics  . Smoking status: Current Every Day Smoker    Types: Cigarettes  . Smokeless tobacco: Never Used  . Alcohol Use: Yes     Comment: occasional   OB History    No data available     Review of Systems  A complete 10 system review of systems was obtained and all systems are negative except as noted in the HPI and PMH.    Allergies  Atropine; Codeine; Ketorolac tromethamine; Meperidine hcl; and Simvastatin  Home Medications   Prior to Admission medications   Medication Sig Start Date End Date Taking? Authorizing Provider  ALPRAZolam Duanne Moron) 1 MG tablet Take 1 tablet (1 mg total) by mouth 3 (three) times daily as needed. 02/29/12   Hali Marry, MD  aspirin 81 MG tablet Take 81 mg by mouth daily.      Historical Provider,  MD  atorvastatin (LIPITOR) 40 MG tablet Take 1 tablet (40 mg total) by mouth daily. 10/12/11   Hali Marry, MD  Cholecalciferol (VITAMIN D) 2000 UNITS tablet Take 2,000 Units by mouth daily.    Historical Provider, MD  ciprofloxacin (CIPRO) 250 MG tablet Take 250 mg by mouth daily.      Historical Provider, MD  dicyclomine (BENTYL) 20 MG tablet Take 1 tablet (20 mg total) by mouth 4 (four) times daily -  before meals and at bedtime. 02/29/12   Hali Marry, MD  furosemide (LASIX) 20 MG tablet Take 1 tablet (20 mg total) by mouth daily as needed. 09/19/11 09/18/12  Hali Marry, MD  Misc Natural Products (PROGESTERONE) 1000 MG/60GM CREA Apply topically daily.      Historical Provider, MD  ranitidine (ZANTAC) 75 MG tablet Take 75 mg by mouth 2 (two) times daily.    Historical Provider,  MD  sertraline (ZOLOFT) 100 MG tablet Take 2 tablets (200 mg total) by mouth daily. 10/12/11   Hali Marry, MD  sertraline (ZOLOFT) 100 MG tablet Take 1 tablet (100 mg total) by mouth 2 (two) times daily. 03/02/12   Hali Marry, MD   Triage Vitals: BP 151/91 mmHg  Pulse 92  Temp(Src) 98.5 F (36.9 C) (Oral)  Resp 20  SpO2 98%   Physical Exam  Constitutional: She is oriented to person, place, and time. She appears well-developed and well-nourished. No distress.  HENT:  Head: Normocephalic and atraumatic.  Nose: Nose normal.  Mouth/Throat: Oropharynx is clear and moist. No oropharyngeal exudate.  Eyes: Conjunctivae and EOM are normal. Pupils are equal, round, and reactive to light. No scleral icterus.  Neck: Normal range of motion. Neck supple. No JVD present. No tracheal deviation present. No thyromegaly present.  Cardiovascular: Normal rate, regular rhythm and normal heart sounds.  Exam reveals no gallop and no friction rub.   No murmur heard. Pulmonary/Chest: Effort normal and breath sounds normal. No respiratory distress. She has no wheezes. She exhibits no tenderness.  Abdominal: Soft. Bowel sounds are normal. She exhibits no distension and no mass. There is no tenderness. There is no rebound and no guarding.  Musculoskeletal: Normal range of motion. She exhibits no edema or tenderness.  Lymphadenopathy:    She has no cervical adenopathy.  Neurological: She is alert and oriented to person, place, and time. No cranial nerve deficit. She exhibits normal muscle tone.  Skin: Skin is warm and dry. No rash noted. No erythema. No pallor.  Nursing note and vitals reviewed.   ED Course  Procedures (including critical care time)  DIAGNOSTIC STUDIES: Oxygen Saturation is 98% on RA, normal by my interpretation.    COORDINATION OF CARE: 11:55 PM-Discussed treatment plan which includes CT A/P, CBC, CMP, Lipase, Lactic acid, UA with pt at bedside and pt agreed to plan.    Labs Review Labs Reviewed  CBC WITH DIFFERENTIAL/PLATELET - Abnormal; Notable for the following:    WBC 13.6 (*)    Neutro Abs 8.9 (*)    Monocytes Absolute 1.4 (*)    All other components within normal limits  COMPREHENSIVE METABOLIC PANEL - Abnormal; Notable for the following:    Sodium 129 (*)    CO2 17 (*)    BUN 42 (*)    Creatinine, Ser 5.44 (*)    ALT 7 (*)    GFR calc non Af Amer 7 (*)    GFR calc Af Amer 9 (*)    All other  components within normal limits  LIPASE, BLOOD - Abnormal; Notable for the following:    Lipase 96 (*)    All other components within normal limits  URINALYSIS, ROUTINE W REFLEX MICROSCOPIC (NOT AT [facility]) - Abnormal; Notable for the following:    APPearance CLOUDY (*)    Hgb urine dipstick TRACE (*)    Leukocytes, UA TRACE (*)    All other components within normal limits  URINE MICROSCOPIC-ADD ON - Abnormal; Notable for the following:    Bacteria, UA FEW (*)    All other components within normal limits  I-STAT CG4 LACTIC ACID, ED - Abnormal; Notable for the following:    Lactic Acid, Venous <0.30 (*)    All other components within normal limits    Imaging Review Ct Abdomen Pelvis Wo Contrast  01/02/2015   CLINICAL DATA:  Acute onset of lower abdominal pain radiating to the back. Nausea, vomiting and chills. Leukocytosis. Elevated lipase. Red blood cells and Arterburn blood cells in the urine. Initial encounter.  EXAM: CT ABDOMEN AND PELVIS WITHOUT CONTRAST  TECHNIQUE: Multidetector CT imaging of the abdomen and pelvis was performed following the standard protocol without IV contrast.  COMPARISON:  None.  FINDINGS: Mild bibasilar atelectasis is noted.  The liver and spleen are unremarkable in appearance. The gallbladder is within normal limits. The pancreas and adrenal glands are unremarkable.  Mild nonspecific perinephric stranding is noted bilaterally. A nonobstructing 4 mm stone is noted at the interpole region of the left kidney. The kidneys are  otherwise unremarkable. There is no evidence of hydronephrosis. No obstructing ureteral stones are seen.  No free fluid is identified. The small bowel is unremarkable in appearance. The stomach is within normal limits. No acute vascular abnormalities are seen. Diffuse calcification is noted along the abdominal aorta and its branches.  The appendix is normal in caliber and contains contrast, without evidence of appendicitis. Contrast progresses to the level of the rectum. The colon is unremarkable in appearance.  The bladder is mildly distended and grossly unremarkable. The uterus is within normal limits. The ovaries are grossly symmetric. No suspicious adnexal masses are seen. No inguinal lymphadenopathy is seen.  No acute osseous abnormalities are identified.  IMPRESSION: 1. No definite CT findings to suggest pancreatitis, though the patient does have an elevated lipase. Would continue following the patient's symptoms and lab values. 2. Diffuse calcification along the abdominal aorta and its branches. 3. Nonobstructing 4 mm stone at the interpole region of the left kidney. 4. Mild bibasilar atelectasis noted.   Electronically Signed   By: Garald Balding M.D.   On: 01/02/2015 03:12   I, Everlene Balls, personally reviewed and evaluated these images and lab results as part of my medical decision-making.   EKG Interpretation None      MDM   Final diagnoses:  None    Patient since emergency department for severe abdominal pain and back pain. She has a history of chronic pain ever states this is different. CT scans in order to evaluate etiology of pain.  Creatinine is 5.4 today elevated from 0.9. This is likely the cause of her symptoms.  Urinalysis does not reveal infection. Postvoid residual will be obtained. [facility]up.  I spoke with Dr. Fabio Neighbors with [facility] who will admit the patient for continued workup. Postvoid residual is 90. She is  currently taking Lasix which is nephrotoxic. CT scan does not show an acute cause of her elevated creatinine. Patient  was given 2 doses of tramadol for pain control.  I personally performed the services described in this documentation, which was scribed in my presence. The recorded information has been reviewed and is accurate.      Everlene Balls, MD 01/02/15 [phone]

## 2015-01-02 ENCOUNTER — Inpatient Hospital Stay (HOSPITAL_COMMUNITY): Payer: Medicare Other

## 2015-01-02 ENCOUNTER — Encounter (HOSPITAL_COMMUNITY): Payer: Self-pay | Admitting: *Deleted

## 2015-01-02 ENCOUNTER — Emergency Department (HOSPITAL_COMMUNITY): Payer: Medicare Other

## 2015-01-02 DIAGNOSIS — Z888 Allergy status to other drugs, medicaments and biological substances status: Secondary | ICD-10-CM | POA: Diagnosis not present

## 2015-01-02 DIAGNOSIS — F419 Anxiety disorder, unspecified: Secondary | ICD-10-CM | POA: Diagnosis present

## 2015-01-02 DIAGNOSIS — Z9119 Patient's noncompliance with other medical treatment and regimen: Secondary | ICD-10-CM | POA: Diagnosis not present

## 2015-01-02 DIAGNOSIS — F17210 Nicotine dependence, cigarettes, uncomplicated: Secondary | ICD-10-CM | POA: Diagnosis present

## 2015-01-02 DIAGNOSIS — N179 Acute kidney failure, unspecified: Secondary | ICD-10-CM | POA: Diagnosis present

## 2015-01-02 DIAGNOSIS — D72829 Elevated white blood cell count, unspecified: Secondary | ICD-10-CM | POA: Diagnosis present

## 2015-01-02 DIAGNOSIS — Z7982 Long term (current) use of aspirin: Secondary | ICD-10-CM | POA: Diagnosis not present

## 2015-01-02 DIAGNOSIS — F329 Major depressive disorder, single episode, unspecified: Secondary | ICD-10-CM | POA: Diagnosis present

## 2015-01-02 DIAGNOSIS — I10 Essential (primary) hypertension: Secondary | ICD-10-CM | POA: Diagnosis present

## 2015-01-02 LAB — COMPREHENSIVE METABOLIC PANEL
ALT: 7 U/L — ABNORMAL LOW (ref 14–54)
ANION GAP: 10 (ref 5–15)
AST: 20 U/L (ref 15–41)
Albumin: 3.7 g/dL (ref 3.5–5.0)
Alkaline Phosphatase: 65 U/L (ref 38–126)
BUN: 42 mg/dL — AB (ref 6–20)
CO2: 17 mmol/L — AB (ref 22–32)
CREATININE: 5.44 mg/dL — AB (ref 0.44–1.00)
Calcium: 9.2 mg/dL (ref 8.9–10.3)
Chloride: 102 mmol/L (ref 101–111)
GFR, EST AFRICAN AMERICAN: 9 mL/min — AB (ref >60)
GFR, EST NON AFRICAN AMERICAN: 7 mL/min — AB (ref >60)
GLUCOSE: 94 mg/dL (ref 65–99)
Potassium: 3.8 mmol/L (ref 3.5–5.1)
Sodium: 129 mmol/L — ABNORMAL LOW (ref 135–145)
TOTAL PROTEIN: 7 g/dL (ref 6.5–8.1)
Total Bilirubin: 0.6 mg/dL (ref 0.3–1.2)

## 2015-01-02 LAB — URINE MICROSCOPIC-ADD ON

## 2015-01-02 LAB — URINALYSIS, ROUTINE W REFLEX MICROSCOPIC
BILIRUBIN URINE: NEGATIVE
GLUCOSE, UA: NEGATIVE mg/dL
Ketones, ur: NEGATIVE mg/dL
Nitrite: NEGATIVE
PH: 5 (ref 5.0–8.0)
Protein, ur: NEGATIVE mg/dL
Specific Gravity, Urine: 1.006 (ref 1.005–1.030)
Urobilinogen, UA: 0.2 mg/dL (ref 0.0–1.0)

## 2015-01-02 LAB — OSMOLALITY, URINE: Osmolality, Ur: 145 mOsm/kg — ABNORMAL LOW (ref 390–1090)

## 2015-01-02 LAB — CREATININE, URINE, RANDOM: CREATININE, URINE: 33.83 mg/dL

## 2015-01-02 LAB — OSMOLALITY: Osmolality: 286 mOsm/kg (ref 275–300)

## 2015-01-02 LAB — SODIUM, URINE, RANDOM: Sodium, Ur: 42 mmol/L

## 2015-01-02 LAB — LIPASE, BLOOD: Lipase: 96 U/L — ABNORMAL HIGH (ref 22–51)

## 2015-01-02 MED ORDER — SODIUM CHLORIDE 0.9 % IV SOLN
INTRAVENOUS | Status: DC
  Administered 2015-01-02 (×2): via INTRAVENOUS

## 2015-01-02 MED ORDER — SERTRALINE HCL 100 MG PO TABS
200.0000 mg | ORAL_TABLET | Freq: Every day | ORAL | Status: DC
  Administered 2015-01-02: 200 mg via ORAL
  Filled 2015-01-02 (×3): qty 2

## 2015-01-02 MED ORDER — HEPARIN SODIUM (PORCINE) 5000 UNIT/ML IJ SOLN
5000.0000 [IU] | Freq: Three times a day (TID) | INTRAMUSCULAR | Status: DC
  Filled 2015-01-02 (×7): qty 1

## 2015-01-02 MED ORDER — ASPIRIN 81 MG PO CHEW
81.0000 mg | CHEWABLE_TABLET | Freq: Every day | ORAL | Status: DC
  Administered 2015-01-02: 81 mg via ORAL
  Filled 2015-01-02 (×3): qty 1

## 2015-01-02 MED ORDER — TRAMADOL HCL 50 MG PO TABS
50.0000 mg | ORAL_TABLET | Freq: Once | ORAL | Status: AC
  Administered 2015-01-02: 50 mg via ORAL
  Filled 2015-01-02: qty 1

## 2015-01-02 MED ORDER — NICOTINE 7 MG/24HR TD PT24
7.0000 mg | MEDICATED_PATCH | Freq: Every day | TRANSDERMAL | Status: DC
  Filled 2015-01-02 (×3): qty 1

## 2015-01-02 MED ORDER — IOHEXOL 300 MG/ML  SOLN
50.0000 mL | Freq: Once | INTRAMUSCULAR | Status: AC | PRN
  Administered 2015-01-02: 50 mL via ORAL

## 2015-01-02 MED ORDER — ACETAMINOPHEN 325 MG PO TABS
650.0000 mg | ORAL_TABLET | Freq: Four times a day (QID) | ORAL | Status: DC | PRN
  Administered 2015-01-02: 650 mg via ORAL
  Filled 2015-01-02: qty 2

## 2015-01-02 MED ORDER — ALPRAZOLAM 1 MG PO TABS
1.0000 mg | ORAL_TABLET | Freq: Three times a day (TID) | ORAL | Status: DC | PRN
  Administered 2015-01-02 (×2): 1 mg via ORAL
  Filled 2015-01-02 (×2): qty 1

## 2015-01-02 NOTE — ED Notes (Signed)
Pt ambulatory to restroom with family member. Steady gait.

## 2015-01-02 NOTE — H&P (Addendum)
[facility] History and Physical  Wendy Houston:[id number] DOB: 1950-12-25 DOA: 01/01/2015  Referring physician: EDP PCP: Beatrice Lecher, MD   Chief Complaint: Abdominal pain   HPI: Wendy Houston is a 64 y.o. female h/o collagenous colitis, barrett esophagus, diverticulitis.  Patient presents to ED with c/o severe and sudden onset abdominal pain with radiation to flanks and back for past 2 days.  Pain is stabbing and cramping.  Has been taking bentyl for the symptoms without relief.  Review of Systems: Systems reviewed.  As above, otherwise negative  Past Medical History  Diagnosis Date  . Hyperlipidemia   . Leukocytosis     felt to be secondary to collagenous colitis; sees Dr Leontine Locket.  . Anxiety   . Collagenous colitis   . Hypertension   . Diverticulosis   . Anemia   . Other abnormal clinical finding 2009    rapid gastric emptying  . Pancreatic insufficiency 02/20/08    as per stool elastase results  . Dumping syndrome   . Lymph edema     around liver  . Arthritis   . Depression   . 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 Gastritis, acute   . Barrett esophagus    Past Surgical History  Procedure Laterality Date  . Tubal ligation    . Tonsillectomy     Social History:  reports that she has been smoking Cigarettes.  She has never used smokeless tobacco. She reports that she drinks alcohol. She reports that she does not use illicit drugs.  Allergies  Allergen Reactions  . Atorvastatin Other (See Comments)    achey  . Atropine Other (See Comments)    Causes huge pupils for days  . Codeine Other (See Comments)    Skin crawls, makes crazy  . Ketorolac Tromethamine Other (See Comments)    Skin crawls, makes crazy  . Meperidine Hcl     All opiates  . Simvastatin Nausea Only    Achey    Family History  Problem Relation Age of Onset  . Breast cancer Mother   . Diabetes Mother   . Hyperlipidemia Mother   . Hypertension Mother    . Ulcers Father   . Colitis Father   . Testicular cancer Son   . Melanoma Son     arm  . Diabetes Son   . Diabetes Maternal Grandmother      Prior to Admission medications   Medication Sig Start Date End Date Taking? Authorizing Provider  ALPRAZolam Duanne Moron) 1 MG tablet Take 1 tablet (1 mg total) by mouth 3 (three) times daily as needed. Patient taking differently: Take 1 mg by mouth 3 (three) times daily as needed for anxiety.  02/29/12  Yes Hali Marry, MD  aspirin 81 MG tablet Take 81 mg by mouth daily.     Yes Historical Provider, MD  sertraline (ZOLOFT) 100 MG tablet Take 2 tablets (200 mg total) by mouth daily. 10/12/11  Yes Hali Marry, MD  valACYclovir (VALTREX) 1000 MG tablet Take 1 tablet by mouth 2 (two) times daily. 12/24/14  Yes Historical Provider, MD  furosemide (LASIX) 20 MG tablet Take 1 tablet (20 mg total) by mouth daily as needed. Patient not taking: Reported on 01/02/2015 09/19/11 09/18/12  Hali Marry, MD   Physical Exam: Filed Vitals:   01/02/15 0310  BP: 148/62  Pulse: 76  Temp:   Resp: 19    BP 148/62 mmHg  Pulse 76  Temp(Src) 98.5 F (36.9 C) (Oral)  Resp 19  SpO2 98%  General Appearance:    Alert, oriented, no distress, appears stated age  Head:    Normocephalic, atraumatic  Eyes:    PERRL, EOMI, sclera non-icteric        Nose:   Nares without drainage or epistaxis. Mucosa, turbinates normal  Throat:   Moist mucous membranes. Oropharynx without erythema or exudate.  Neck:   Supple. No carotid bruits.  No thyromegaly.  No lymphadenopathy.   Back:     Bilateral severe CVA tenderness.  Lungs:     Clear to auscultation bilaterally, without wheezes, rhonchi or rales  Chest wall:    No tenderness to palpitation  Heart:    Regular rate and rhythm without murmurs, gallops, rubs  Abdomen:     Soft, non-tender, nondistended, normal bowel sounds, no organomegaly  Genitalia:    deferred  Rectal:    deferred  Extremities:   No  clubbing, cyanosis or edema.  Pulses:   2+ and symmetric all extremities  Skin:   Skin color, texture, turgor normal, no rashes or lesions  Lymph nodes:   Cervical, supraclavicular, and axillary nodes normal  Neurologic:   CNII-XII intact. Normal strength, sensation and reflexes      throughout    Labs on Admission:  Basic Metabolic Panel:  Recent Labs Lab 01/01/15 2337  NA 129*  K 3.8  CL 102  CO2 17*  GLUCOSE 94  BUN 42*  CREATININE 5.44*  CALCIUM 9.2   Liver Function Tests:  Recent Labs Lab 01/01/15 2337  AST 20  ALT 7*  ALKPHOS 65  BILITOT 0.6  PROT 7.0  ALBUMIN 3.7    Recent Labs Lab 01/01/15 2337  LIPASE 96*   No results for input(s): AMMONIA in the last 168 hours. CBC:  Recent Labs Lab 01/01/15 2337  WBC 13.6*  NEUTROABS 8.9*  HGB 13.0  HCT 38.0  MCV 90.5  PLT 212   Cardiac Enzymes: No results for input(s): CKTOTAL, CKMB, CKMBINDEX, TROPONINI in the last 168 hours.  BNP (last 3 results) No results for input(s): PROBNP in the last 8760 hours. CBG: No results for input(s): GLUCAP in the last 168 hours.  Radiological Exams on Admission: Ct Abdomen Pelvis Wo Contrast  01/02/2015   CLINICAL DATA:  Acute onset of lower abdominal pain radiating to the back. Nausea, vomiting and chills. Leukocytosis. Elevated lipase. Red blood cells and Armendariz blood cells in the urine. Initial encounter.  EXAM: CT ABDOMEN AND PELVIS WITHOUT CONTRAST  TECHNIQUE: Multidetector CT imaging of the abdomen and pelvis was performed following the standard protocol without IV contrast.  COMPARISON:  None.  FINDINGS: Mild bibasilar atelectasis is noted.  The liver and spleen are unremarkable in appearance. The gallbladder is within normal limits. The pancreas and adrenal glands are unremarkable.  Mild nonspecific perinephric stranding is noted bilaterally. A nonobstructing 4 mm stone is noted at the interpole region of the left kidney. The kidneys are otherwise unremarkable. There  is no evidence of hydronephrosis. No obstructing ureteral stones are seen.  No free fluid is identified. The small bowel is unremarkable in appearance. The stomach is within normal limits. No acute vascular abnormalities are seen. Diffuse calcification is noted along the abdominal aorta and its branches.  The appendix is normal in caliber and contains contrast, without evidence of appendicitis. Contrast progresses to the level of the rectum. The colon is unremarkable in appearance.  The bladder is mildly distended and grossly unremarkable.  The uterus is within normal limits. The ovaries are grossly symmetric. No suspicious adnexal masses are seen. No inguinal lymphadenopathy is seen.  No acute osseous abnormalities are identified.  IMPRESSION: 1. No definite CT findings to suggest pancreatitis, though the patient does have an elevated lipase. Would continue following the patient's symptoms and lab values. 2. Diffuse calcification along the abdominal aorta and its branches. 3. Nonobstructing 4 mm stone at the interpole region of the left kidney. 4. Mild bibasilar atelectasis noted.   Electronically Signed   By: Garald Balding M.D.   On: 01/02/2015 03:12    EKG: Independently reviewed.  Assessment/Plan Principal Problem:   Acute kidney failure   1. 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 Admit to inpatient 2. CT abd/pelvis just shows mild bilateral non-specific perinephric stranding  3. UA shows trace blood, no protein, which isnt really c/w acute nephritic or nephrotic syndromes 4. Hold lasix 5. Hold valcyclovir 6. Needs nephrology consult in AM and possibly even kidney biopsy 7. Gentle hydration at 75 cc/hr, but patient dosent appear to be pre-renal.    Code Status: Full Code  Family Communication: Family at bedside Disposition Plan: Admit to inpatient   Time spent: 70 min  GARDNER, JARED M. [facility] Pager [phone]  If 7AM-7PM, please contact the day team taking care of  the patient [email or web address] Password [email or web address] 01/02/2015, 3:12 AM

## 2015-01-02 NOTE — Consult Note (Signed)
Renal Service Consult Note [place] 01/02/2015 [place] Requesting Physician:  Dr Ree Kida  Reason for Consult:  Acute renal failure HPI: The patient is a 64 y.o. year-old with hx of HTN, tobacco use presented with severe abd pain and abd distension for 2-3 days.  No BM x several days and severe back pain. Hx of collagenous colitis, Barrett's esophagus and diverticulitis in the past.  +nausea, vomiting, chills and headaceh. Takes Bentyl for IBS and has had poor digestion/ liquid BM's for about a year now. No hx renal failure.   [place] beach [place]" and about 2-3 weeks ago developed a skin condition, went to a dermatologist at the beach who diagnosed her with "flat warts" or verucca plana. Patient says she was prescribed "Zovirax" 3 pills a day and started taking that about 2 weeks ago. AFter developing this abd pain and back pain she stopped just a day or two ago taking the Zovirax.  She had lesions on her face, hands and feet but most of them have resolved.    Home meds > xanax, asa, Zoloft, lasix 20/d, lipitor, Bentyl  Admits: Nov 2011 > chest pain, unclear etiology. ^WBC.  HTN, tobacco use, ruled out for MI and referred for OP cardiology evaluation.     ROS  denies hx kidney failure  +hx ~8 kidney stones, "calcium" kind over  5 year period ihn the 1970's  +tobacco 1ppd for many years  +hx collagenous colitis, Barrett's esophagus and diverticulitis   No dysuria, no red or brown urine  no voiding complaints  no abd pain now  flank pain better  no SOB, cough, fevers, chills sweats  no wt loss or gain  no confusion   no arthralgias  Past Medical History  Past Medical History  Diagnosis Date  . Hyperlipidemia   . Leukocytosis     felt to be secondary to collagenous colitis; sees Dr Leontine Locket.  . Anxiety   . Collagenous colitis   . Hypertension   . Diverticulosis   . Anemia   . Other abnormal clinical finding 2009   rapid gastric emptying  . Pancreatic insufficiency 02/20/08    as per stool elastase results  . Dumping syndrome   . Lymph edema     around liver  . Arthritis   . Depression   . 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 Gastritis, acute   . Barrett esophagus    Past Surgical History  Past Surgical History  Procedure Laterality Date  . Tubal ligation    . Tonsillectomy     Family History  Family History  Problem Relation Age of Onset  . Breast cancer Mother   . Diabetes Mother   . Hyperlipidemia Mother   . Hypertension Mother   . Ulcers Father   . Colitis Father   . Testicular cancer Son   . Melanoma Son     arm  . Diabetes Son   . Diabetes Maternal Grandmother    Social History  reports that she has been smoking Cigarettes.  She has never used smokeless tobacco. She reports that she drinks alcohol. She reports that she does not use illicit drugs. Allergies  Allergies  Allergen Reactions  . Atorvastatin Other (See Comments)    achey  . Atropine Other (See Comments)    Causes huge pupils for days  . Codeine Other (See Comments)    Skin crawls, makes  crazy  . Ketorolac Tromethamine Other (See Comments)    Skin crawls, makes crazy  . Meperidine Hcl     All opiates  . Simvastatin Nausea Only    Achey   Home medications Prior to Admission medications   Medication Sig Start Date End Date Taking? Authorizing Provider  ALPRAZolam Duanne Moron) 1 MG tablet Take 1 tablet (1 mg total) by mouth 3 (three) times daily as needed. Patient taking differently: Take 1 mg by mouth 3 (three) times daily as needed for anxiety.  02/29/12  Yes Hali Marry, MD  aspirin 81 MG tablet Take 81 mg by mouth daily.     Yes Historical Provider, MD  sertraline (ZOLOFT) 100 MG tablet Take 2 tablets (200 mg total) by mouth daily. 10/12/11  Yes Hali Marry, MD  valACYclovir (VALTREX) 1000 MG tablet Take 1 tablet by mouth 2 (two) times daily. 12/24/14  Yes Historical Provider,  MD  furosemide (LASIX) 20 MG tablet Take 1 tablet (20 mg total) by mouth daily as needed. Patient not taking: Reported on 01/02/2015 09/19/11 09/18/12  Hali Marry, MD   Liver Function Tests  Recent Labs Lab 01/01/15 2337  AST 20  ALT 7*  ALKPHOS 65  BILITOT 0.6  PROT 7.0  ALBUMIN 3.7    Recent Labs Lab 01/01/15 2337  LIPASE 96*   CBC  Recent Labs Lab 01/01/15 2337  WBC 13.6*  NEUTROABS 8.9*  HGB 13.0  HCT 38.0  MCV 90.5  PLT 979   Basic Metabolic Panel  Recent Labs Lab 01/01/15 2337  NA 129*  K 3.8  CL 102  CO2 17*  GLUCOSE 94  BUN 42*  CREATININE 5.44*  CALCIUM 9.2    Filed Vitals:   01/02/15 0100 01/02/15 0310 01/02/15 0401 01/02/15 1320  BP: 153/68 148/62 160/66 157/75  Pulse: 69 76 74 81  Temp:   98.1 F (36.7 C) 98 F (36.7 C)  TempSrc:   Oral Oral  Resp: _0 Height:   4' 11" (1.499 m)   Weight:   50.894 kg (112 lb 3.2 oz)   SpO2: 98% 98% 99% 96%   Exam Alert pleasant healthy-appearing female in no distress No rash, cyanosis or gangrene Sclera anicteric, throat clear No jvd Chest clear bilat No axillary or cervical nodes RRR no MRG Abd soft ntnd no mass or ascites No joint changes, effusions No skin rash Neuro is nf, ox 3, no asterixis   WBC 13k Hb 13 plts 212   Na 129  K 3.8  BUN 42  Cr 5.44   Ca 9.2  Alb 3.7  LFT's ok, tprot 7.0, lipase 96, eGFR 7-9 UA > 3-6 rbc, 7-10 wbc, 1.006, prot negative, trace LE/ neg nitrite, cloudy UNa = 42, UCr 33.8 CT abd > mild perinephric stranding bilat, no hydro, no obstructing stone. L interpole renal stone, non-obstructing. Bladder unremarkable. Diffuse Ca++ of aorta and it's branches Renal US > normal [place], 12-13 cm kidneys, no hydro  Urine sediment > 3-5 wbc's per HPF was the main finding, no rbc's, 2-3 gran casts and 1-2 WBC casts were noted, occ RTE cells  Assessment: 1. Renal failure - acute in setting of warts outbreak and oral acyclovir exposure. Acyclovir is known to  cause AKI with the IV formulation due to crystal precipitation. She was taking the oral type. Suspect this is drug-related though. The sediment is consistent with AIN primarily.  Rx for now will be supportive care, watch symptoms/ UOP  and creat off of the antiviral agent. If not better in several days will plan steroid course vs. renal biopsy.    Plan- as above  Rob  MD (pgr) [phone]    (c) [phone] 01/02/2015, 2:31 PM    

## 2015-01-02 NOTE — Progress Notes (Signed)
   [facility]                                                                              Patient Demographics  Wendy Houston, is a 64 y.o. female, DOB - [birth year], ZOX:[id number]  Admit date - 01/01/2015   Admitting Physician Etta Quill, DO  Outpatient Primary MD for the patient is METHENEY,CATHERINE, MD  LOS - 0   Chief Complaint  Patient presents with  . Abdominal Pain      HPI on 01/02/2015 by Dr. Jennette Kettle Wendy Houston is a 64 y.o. female h/o collagenous colitis, barrett esophagus, diverticulitis. Patient presents to ED with c/o severe and sudden onset abdominal pain with radiation to flanks and back for past 2 days. Pain is stabbing and cramping. Has been taking bentyl for the symptoms without relief.  Assessment & Plan   Patient admitted eariler today by Dr. Jennette Kettle.  Agree with assessment and plan.  See full H&P for details.   Acute kidney Injury vs AIN -Creatinine 5.44 upon admission.  In 2012, baseline <1 -Renal ultrasound unremarkable -CT abdomen and pelvis: Nonobstructing 4 mm stone at the interpole region of the left kidney, with mild nonspecific perinephric stranding bilaterally -UA negative for infection, shows amorphous urates -Nephrology consulted and appreciated, recommended supportive care and monitoring of creatinine. If no improvement within several days, planned sterilely course versus renal biopsy. -Patient was taking acyclovir which per nephrology has been known to cause AKI -Lasix and Valtrex held -Continue IVF -Continue to monitor urine output   Depression/anxiety -Continue Zoloft, Xanax when necessary  History of colitis -Bentyl held  Leukocytosis -Reactive, will continue to monitor CBC -UA negative for infection -No complaints of cough or shortness of breath -Currently afebrile  Code Status: Full  Family Communication: Husband at bedside  Disposition Plan: Admitted, continue to monitor creatinine  Time Spent in  minutes   30 minutes  Procedures  Renal US  Consults   Nephrology  DVT Prophylaxis  Heparin   Wendy Houston D.O. on 01/02/2015 at 5:25 PM  Between 7am to 7pm - Pager - [phone]  After 7pm go to [email or web address] - password [email or web address]  And look for the night coverage person covering for me after hours  [facility] Office  [phone]

## 2015-01-03 LAB — BASIC METABOLIC PANEL
Anion gap: 11 (ref 5–15)
BUN: 35 mg/dL — ABNORMAL HIGH (ref 6–20)
CO2: 16 mmol/L — AB (ref 22–32)
Calcium: 9.3 mg/dL (ref 8.9–10.3)
Chloride: 111 mmol/L (ref 101–111)
Creatinine, Ser: 3.14 mg/dL — ABNORMAL HIGH (ref 0.44–1.00)
GFR calc Af Amer: 17 mL/min — ABNORMAL LOW (ref >60)
GFR, EST NON AFRICAN AMERICAN: 14 mL/min — AB (ref >60)
Glucose, Bld: 88 mg/dL (ref 65–99)
Potassium: 3.8 mmol/L (ref 3.5–5.1)
SODIUM: 138 mmol/L (ref 135–145)

## 2015-01-03 LAB — CBC
HCT: 39.5 % (ref 36.0–46.0)
HEMOGLOBIN: 13.5 g/dL (ref 12.0–15.0)
MCH: 31 pg (ref 26.0–34.0)
MCHC: 34.2 g/dL (ref 30.0–36.0)
MCV: 90.8 fL (ref 78.0–100.0)
PLATELETS: 241 10*3/uL (ref 150–400)
RBC: 4.35 MIL/uL (ref 3.87–5.11)
RDW: 13.5 % (ref 11.5–15.5)
WBC: 9.3 10*3/uL (ref 4.0–10.5)

## 2015-01-03 NOTE — Progress Notes (Signed)
Patient requested to go off the unit this morning but was told she could not at this time. Patient has been walking around with her spouse and appeared agitated. She has been refusing her nicotine patch since being admitted. Patient later informed me that she was going home because"they are not doing anything for me here. They do not known what is wrong with me. I can go to my doctor's office." Spouse who was with patient stated he asked her to say until the doctor made his round but she refused. Patient left the the unit at approximately 0549. NP Donnal Debar was informed of patient's decision to leave against medical advice at approximately 0543.

## 2015-01-03 NOTE — Discharge Summary (Signed)
Physician Discharge Summary  Wendy Houston JKD:[id number] DOB: August 11, 1950 DOA: 01/01/2015  PCP: Beatrice Lecher, MD  Admit date: 01/01/2015 Discharge date: 01/03/2015  Time spent: 15 minutes  Recommendations for Outpatient Follow-up:  No recommendations as patient left AMA.   Discharge Diagnoses:  Acute kidney injury versus AIN Depression/anxiety History of colitis Leukocytosis  Discharge Condition: Unknown  Diet recommendation: None  Filed Weights   01/02/15 0401  Weight: 50.894 kg (112 lb 3.2 oz)    History of present illness:  on 01/02/2015 by Dr. Jennette Kettle Wendy Houston is a 64 y.o. female h/o collagenous colitis, barrett esophagus, diverticulitis. Patient presents to ED with c/o severe and sudden onset abdominal pain with radiation to flanks and back for past 2 days. Pain is stabbing and cramping. Has been taking bentyl for the symptoms without relief.  Hospital Course:  64 year old female admitted with acute kidney injury, creatinine 5.44. She was placed on IVF, nephrology was consulted. Kidney function did improve 3.14. Patient slightly AMA overnight.  Acute kidney Injury vs AIN -Creatinine 5.44 upon admission. In 2012, baseline <1 -Renal ultrasound unremarkable -CT abdomen and pelvis: Nonobstructing 4 mm stone at the interpole region of the left kidney, with mild nonspecific perinephric stranding bilaterally -UA negative for infection, shows amorphous urates -Nephrology consulted and appreciated, recommended supportive care and monitoring of creatinine. If no improvement within several days, planned sterilely course versus renal biopsy. -Patient was taking acyclovir which per nephrology has been known to cause AKI -Lasix and Valtrex held -Was placed on IVF  Depression/anxiety -Continue Zoloft, Xanax when necessary  History of colitis -Bentyl held  Leukocytosis -Reactive, will continue to monitor CBC -UA negative for infection -No complaints of  cough or shortness of breath -Currently afebrile  Procedures: Renal ultrasound  Consultations: Nephrology  Discharge Exam: Filed Vitals:   01/02/15 2200  BP: 156/68  Pulse: 73  Temp: 98.1 F (36.7 C)  Resp: 16   No Physical exam, patient left AMA.  Discharge Instructions None    Medication List    ASK your doctor about these medications        ALPRAZolam 1 MG tablet  Commonly known as:  XANAX  Take 1 tablet (1 mg total) by mouth 3 (three) times daily as needed.     aspirin 81 MG tablet  Take 81 mg by mouth daily.     furosemide 20 MG tablet  Commonly known as:  LASIX  Take 1 tablet (20 mg total) by mouth daily as needed.     sertraline 100 MG tablet  Commonly known as:  ZOLOFT  Take 2 tablets (200 mg total) by mouth daily.     valACYclovir 1000 MG tablet  Commonly known as:  VALTREX  Take 1 tablet by mouth 2 (two) times daily.       Allergies  Allergen Reactions  . Atorvastatin Other (See Comments)    achey  . Atropine Other (See Comments)    Causes huge pupils for days  . Codeine Other (See Comments)    Skin crawls, makes crazy  . Ketorolac Tromethamine Other (See Comments)    Skin crawls, makes crazy  . Meperidine Hcl     All opiates  . Simvastatin Nausea Only    Achey      The results of significant diagnostics from this hospitalization (including imaging, microbiology, ancillary and laboratory) are listed below for reference.    Significant Diagnostic Studies: Ct Abdomen Pelvis Wo Contrast  01/02/2015   CLINICAL DATA:  Acute onset  of lower abdominal pain radiating to the back. Nausea, vomiting and chills. Leukocytosis. Elevated lipase. Red blood cells and Peer blood cells in the urine. Initial encounter.  EXAM: CT ABDOMEN AND PELVIS WITHOUT CONTRAST  TECHNIQUE: Multidetector CT imaging of the abdomen and pelvis was performed following the standard protocol without IV contrast.  COMPARISON:  None.  FINDINGS: Mild bibasilar atelectasis is  noted.  The liver and spleen are unremarkable in appearance. The gallbladder is within normal limits. The pancreas and adrenal glands are unremarkable.  Mild nonspecific perinephric stranding is noted bilaterally. A nonobstructing 4 mm stone is noted at the interpole region of the left kidney. The kidneys are otherwise unremarkable. There is no evidence of hydronephrosis. No obstructing ureteral stones are seen.  No free fluid is identified. The small bowel is unremarkable in appearance. The stomach is within normal limits. No acute vascular abnormalities are seen. Diffuse calcification is noted along the abdominal aorta and its branches.  The appendix is normal in caliber and contains contrast, without evidence of appendicitis. Contrast progresses to the level of the rectum. The colon is unremarkable in appearance.  The bladder is mildly distended and grossly unremarkable. The uterus is within normal limits. The ovaries are grossly symmetric. No suspicious adnexal masses are seen. No inguinal lymphadenopathy is seen.  No acute osseous abnormalities are identified.  IMPRESSION: 1. No definite CT findings to suggest pancreatitis, though the patient does have an elevated lipase. Would continue following the patient's symptoms and lab values. 2. Diffuse calcification along the abdominal aorta and its branches. 3. Nonobstructing 4 mm stone at the interpole region of the left kidney. 4. Mild bibasilar atelectasis noted.   Electronically Signed   By: Garald Balding M.D.   On: 01/02/2015 03:12   US Renal  01/02/2015   CLINICAL DATA:  Acute kidney injury  EXAM: RENAL / URINARY TRACT ULTRASOUND COMPLETE  COMPARISON:  None.  FINDINGS: Right Kidney:  Length: 12.2 cm. Echogenicity within normal limits. No mass or hydronephrosis visualized.  Left Kidney:  Length: 12.7 cm. Echogenicity within normal limits. No mass or hydronephrosis visualized.  Bladder:  Appears normal for degree of bladder distention.  IMPRESSION: Normal  renal ultrasound.   Electronically Signed   By: Kathreen Devoid   On: 01/02/2015 11:38    Microbiology: No results found for this or any previous visit (from the past 240 hour(s)).   Labs: Basic Metabolic Panel:  Recent Labs Lab 01/01/15 2337 01/03/15 0445  NA 129* 138  K 3.8 3.8  CL 102 111  CO2 17* 16*  GLUCOSE 94 88  BUN 42* 35*  CREATININE 5.44* 3.14*  CALCIUM 9.2 9.3   Liver Function Tests:  Recent Labs Lab 01/01/15 2337  AST 20  ALT 7*  ALKPHOS 65  BILITOT 0.6  PROT 7.0  ALBUMIN 3.7    Recent Labs Lab 01/01/15 2337  LIPASE 96*   No results for input(s): AMMONIA in the last 168 hours. CBC:  Recent Labs Lab 01/01/15 2337 01/03/15 0445  WBC 13.6* 9.3  NEUTROABS 8.9*  --   HGB 13.0 13.5  HCT 38.0 39.5  MCV 90.5 90.8  PLT 212 241   Cardiac Enzymes: No results for input(s): CKTOTAL, CKMB, CKMBINDEX, TROPONINI in the last 168 hours. BNP: BNP (last 3 results) No results for input(s): BNP in the last 8760 hours.  ProBNP (last 3 results) No results for input(s): PROBNP in the last 8760 hours.  CBG: No results for input(s): GLUCAP in the last 168  hours.     Signed:  Cristal Ford  [facility] 01/03/2015, 2:29 PM

## 2015-07-22 ENCOUNTER — Ambulatory Visit: Payer: Medicare Other | Admitting: Hematology & Oncology

## 2015-07-22 ENCOUNTER — Other Ambulatory Visit: Payer: Medicare Other

## 2015-07-22 ENCOUNTER — Ambulatory Visit: Payer: Medicare Other

## 2015-07-30 ENCOUNTER — Other Ambulatory Visit: Payer: Medicare Other

## 2015-07-30 ENCOUNTER — Ambulatory Visit: Payer: Medicare Other | Admitting: Hematology & Oncology

## 2015-07-30 ENCOUNTER — Ambulatory Visit: Payer: Medicare Other

## 2015-08-20 ENCOUNTER — Ambulatory Visit: Payer: Medicare Other | Admitting: Hematology & Oncology

## 2015-08-20 ENCOUNTER — Ambulatory Visit: Payer: Medicare Other

## 2015-08-20 ENCOUNTER — Other Ambulatory Visit: Payer: Medicare Other

## 2016-03-21 ENCOUNTER — Ambulatory Visit (HOSPITAL_BASED_OUTPATIENT_CLINIC_OR_DEPARTMENT_OTHER): Payer: Medicare Other | Admitting: Hematology & Oncology

## 2016-03-21 ENCOUNTER — Ambulatory Visit: Payer: Medicare Other

## 2016-03-21 ENCOUNTER — Encounter: Payer: Self-pay | Admitting: Hematology & Oncology

## 2016-03-21 ENCOUNTER — Other Ambulatory Visit (HOSPITAL_BASED_OUTPATIENT_CLINIC_OR_DEPARTMENT_OTHER): Payer: Medicare Other

## 2016-03-21 VITALS — BP 152/60 | HR 90 | Temp 97.7°F | Wt 110.4 lb

## 2016-03-21 DIAGNOSIS — D72829 Elevated white blood cell count, unspecified: Secondary | ICD-10-CM

## 2016-03-21 DIAGNOSIS — C9112 Chronic lymphocytic leukemia of B-cell type in relapse: Secondary | ICD-10-CM

## 2016-03-21 DIAGNOSIS — C9192 Lymphoid leukemia, unspecified, in relapse: Secondary | ICD-10-CM

## 2016-03-21 LAB — CBC WITH DIFFERENTIAL (CANCER CENTER ONLY)
BASO#: 0.1 10*3/uL (ref 0.0–0.2)
BASO%: 0.3 % (ref 0.0–2.0)
EOS ABS: 0.3 10*3/uL (ref 0.0–0.5)
EOS%: 2.1 % (ref 0.0–7.0)
HCT: 42.4 % (ref 34.8–46.6)
HGB: 14.3 g/dL (ref 11.6–15.9)
LYMPH#: 6.1 10*3/uL — ABNORMAL HIGH (ref 0.9–3.3)
LYMPH%: 36.9 % (ref 14.0–48.0)
MCH: 31.1 pg (ref 26.0–34.0)
MCHC: 33.7 g/dL (ref 32.0–36.0)
MCV: 92 fL (ref 81–101)
MONO#: 1.3 10*3/uL — AB (ref 0.1–0.9)
MONO%: 7.6 % (ref 0.0–13.0)
NEUT#: 8.8 10*3/uL — ABNORMAL HIGH (ref 1.5–6.5)
NEUT%: 53.1 % (ref 39.6–80.0)
PLATELETS: 297 10*3/uL (ref 145–400)
RBC: 4.6 10*6/uL (ref 3.70–5.32)
RDW: 14 % (ref 11.1–15.7)
WBC: 16.5 10*3/uL — AB (ref 3.9–10.0)

## 2016-03-21 LAB — CHCC SATELLITE - SMEAR

## 2016-03-21 NOTE — Progress Notes (Signed)
Referral MD  Reason for Referral: Chronic leukocytosis   Chief Complaint  Patient presents with  . New Patient (Initial Visit)  : I don't feel well. My Persico cell count is high.  HPI: Ms. Linch is a very nice 65 year old Caucasian female. She has a lot of medical problems. She is on quite a few medications.  She apparently has collagenous colitis. She has Barrett's esophagus.  She's been mostly worried about these skin lesions. They seem to be on her arms and legs. There may be an occasional one on her face. She apparently was found to have lichen sclerosis. She actually was put on valacyclovir. This caused her kidneys to fail. She has had chronic kidney failure.   She has been seen by multiple doctors. She's never had surgery before. She does not smoke. She does not drink. She said that she also developed a lesion in the back of her eye. She then saw an ophthalmologist but just had a one-time visit.  She says no one can figure out what is wrong with her. According to some of the doctor's notes, she has not been compliant with medications.  She was kindly referred to the [place] because her Blankenhorn cell count has been elevated.  From the records that were kindly sent to [place], back in August, her Halterman cell count is 14.7. Her hemoglobin was 14.8 and platelet 2 33,000. Her MCV was 92. She had a normal Brouillet cell differential.  On October 13, her Devera cell count was 12.1. Hemoglobin 13.9. Lately count 2 79,000. MCV was 92. She again had a relatively normal Neidig cell differential.  She apparently developed renal failure a year ago. This was from valacyclovir. Her creatinine was 3.14. Most recently, her creatinine was 0.69.  She still thinks there are kidney problems. She has issues going to the bathroom. She says that she has bad diarrhea. She cannot going to wear because of the diarrhea.  She gets bloated all the time.  She says that she does not have a  gastroenterologist. She says she was seen out at [facility]. I'm not sure as to why she cannot go out there again. I will know if it might be insurance issues.  She has not had any problems with obvious bleeding. She's had no obvious infections. She's had no obvious leg swelling.  Overall, I set her performance status is ECOG 1.    Past Medical History:  Diagnosis Date  . Anemia   . Anxiety   . Arthritis   . Barrett esophagus   . Collagenous colitis   . Depression   . Diverticulosis   . Dumping syndrome   . Gastritis, acute   . 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 Hyperlipidemia   . Hypertension   . Leukocytosis    felt to be secondary to collagenous colitis; sees Dr Leontine Locket.  . Lymph edema    around liver  . Other abnormal clinical finding 2009   rapid gastric emptying  . Pancreatic insufficiency 02/20/08   as per stool elastase results  :  Past Surgical History:  Procedure Laterality Date  . TONSILLECTOMY    . TUBAL LIGATION    :   Current Outpatient Prescriptions:  .  ALPRAZolam (XANAX) 1 MG tablet, Take 1 tablet (1 mg total) by mouth 3 (three) times daily as needed. (Patient taking differently: Take 1 mg by mouth 3 (three) times daily as needed for anxiety. ), Disp:  45 tablet, Rfl: 0 .  aspirin 81 MG tablet, Take 81 mg by mouth daily.  , Disp: , Rfl:  .  atorvastatin (LIPITOR) 20 MG tablet, Take 20 mg by mouth daily., Disp: , Rfl:  .  dicyclomine (BENTYL) 20 MG tablet, Take 20 mg by mouth 3 (three) times daily before meals., Disp: , Rfl:  .  sertraline (ZOLOFT) 100 MG tablet, Take 2 tablets (200 mg total) by mouth daily., Disp: 180 tablet, Rfl: 1:  :  Allergies  Allergen Reactions  . Atorvastatin Other (See Comments)    achey  . Atropine Other (See Comments)    Causes huge pupils for days  . Codeine Other (See Comments)    Skin crawls, makes crazy  . Ketorolac Tromethamine Other (See Comments)    Skin crawls, makes crazy  . Meperidine Hcl      All opiates  . Simvastatin Nausea Only    Achey  . Valtrex [Valacyclovir Hcl] Other (See Comments)    "Kidneys shut down"  :  Family History  Problem Relation Age of Onset  . Breast cancer Mother   . Diabetes Mother   . Hyperlipidemia Mother   . Hypertension Mother   . Ulcers Father   . Colitis Father   . Testicular cancer Son   . Melanoma Son     arm  . Diabetes Son   . Diabetes Maternal Grandmother   :  Social History   Social History  . Marital status: Married    Spouse name: Fritz Pickerel   . Number of children: 2  . Years of education: High schoo   Occupational History  . Systems developer Employed   Social History Main Topics  . Smoking status: Current Every Day Smoker    Types: Cigarettes  . Smokeless tobacco: Never Used  . Alcohol use Yes     Comment: occasional  . Drug use: No  . Sexual activity: Not on file   Other Topics Concern  . Not on file   Social History Narrative   2 Carafate per day. No regular exercise.  :  Pertinent items are noted in HPI.  Exam: '@IPVITALS'$ @ [facility] female in no obvious distress. Her vital signs show a temperature of 97.7. Pulse 90. Blood pressure 152/60. Weight is 110 pounds. Head and neck exam shows no ocular or oral lesions. She has no palpable cervical or supraclavicular lymph nodes. Lungs are clear bilaterally. Cardiac exam regular in rhythm with no murmurs, rubs or bruits. Axillary exam shows no bilateral axillary adenopathy. Back exam shows no tenderness over the spine, ribs or hips. There is no osteoporotic changes. Extremities shows no clubbing, cyanosis or edema. She has good range of motion of her joints. She has good muscle strength in upper and lower extremities. Skin exam does show minimal skin lesions.  There appear to be papular type lesions. Some soon-to-be crusted over. No blisters are noted. Her skin is a little dry.  Neurological exam shows no focal neurological deficits.   Recent  Labs  03/21/16 1430  WBC 16.5*  HGB 14.3  HCT 42.4  PLT 297   No results for input(s): NA, K, CL, CO2, GLUCOSE, BUN, CREATININE, CALCIUM in the last 72 hours.  Blood smear review:  Normochromic and normocytic population of red blood cells. I do not see any nucleated red blood cells. There is no rouleau formation. There is no schistocytes or spherocytes. Seefeldt blood cells are increased in number. She does have some immature Seamans  blood cells. She has some mature appearing lymphocytes. There are some hypersegmented polys. She has some eosinophils. I do not see any blasts. Platelets do show some large platelets.  Pathology: None     Assessment and Plan:  Ms. Capron is a 65 year old [place] female. It is hard to say what is going on with her. It is hard to know whether another accident is any problem.  I think the easiest test that we can do is a bone marrow biopsy on her. I did this would tell us whether or not she has a myeloproliferative disorder. I also gives her iron stores. We can see if there is any obvious infiltrative disease.  From her medical record sent by her doctor, it is unclear whether there is actually any problem. It seems like the doctor didn't know what else to do with her except to send her to [place] because of the high Dull cells.  I will go ahead and get some extra lab work on her. I'm glad to see that her she does not have renal failure. At first, thought that she may have an underlying polycythemia given that if she had renal failure, she was not anemic. She seems to be under the impression that she still has kidney trouble.  I spent about a hour with her. She is very nice. I feel bad for her. I'm not sure of this rash is of any significance. It sounds like people know what it is. She certainly seems to be under the impression that no one knows what is wrong with her.  I'm not sure what to do about this collagenous colitis. In the week and get her to gastroenterology and they  might be oh to help.  We will probably get her back in about a month or so.

## 2016-03-24 ENCOUNTER — Other Ambulatory Visit (HOSPITAL_BASED_OUTPATIENT_CLINIC_OR_DEPARTMENT_OTHER): Payer: Medicare Other

## 2016-03-24 DIAGNOSIS — D72829 Elevated white blood cell count, unspecified: Secondary | ICD-10-CM

## 2016-03-24 DIAGNOSIS — C9110 Chronic lymphocytic leukemia of B-cell type not having achieved remission: Secondary | ICD-10-CM

## 2016-03-24 DIAGNOSIS — C9112 Chronic lymphocytic leukemia of B-cell type in relapse: Secondary | ICD-10-CM

## 2016-03-24 DIAGNOSIS — C9192 Lymphoid leukemia, unspecified, in relapse: Secondary | ICD-10-CM

## 2016-03-24 LAB — CBC WITH DIFFERENTIAL/PLATELET
BASO%: 0.6 % (ref 0.0–2.0)
Basophils Absolute: 0.1 10*3/uL (ref 0.0–0.1)
EOS ABS: 0.2 10*3/uL (ref 0.0–0.5)
EOS%: 2 % (ref 0.0–7.0)
HCT: 46 % (ref 34.8–46.6)
HGB: 15.6 g/dL (ref 11.6–15.9)
LYMPH%: 48.1 % (ref 14.0–49.7)
MCH: 31 pg (ref 25.1–34.0)
MCHC: 33.9 g/dL (ref 31.5–36.0)
MCV: 91.3 fL (ref 79.5–101.0)
MONO#: 0.7 10*3/uL (ref 0.1–0.9)
MONO%: 6 % (ref 0.0–14.0)
NEUT#: 5.1 10*3/uL (ref 1.5–6.5)
NEUT%: 43.3 % (ref 38.4–76.8)
Platelets: 306 10*3/uL (ref 145–400)
RBC: 5.04 10*6/uL (ref 3.70–5.45)
RDW: 14.1 % (ref 11.2–14.5)
WBC: 11.8 10*3/uL — ABNORMAL HIGH (ref 3.9–10.3)
lymph#: 5.7 10*3/uL — ABNORMAL HIGH (ref 0.9–3.3)

## 2016-03-24 LAB — TECHNOLOGIST REVIEW

## 2016-03-24 LAB — URIC ACID: URIC ACID, SERUM: 3.6 mg/dL (ref 2.6–7.4)

## 2016-03-24 LAB — COMPREHENSIVE METABOLIC PANEL
ALBUMIN: 4.4 g/dL (ref 3.5–5.0)
ALT: 11 U/L (ref 0–55)
AST: 19 U/L (ref 5–34)
Alkaline Phosphatase: 117 U/L (ref 40–150)
Anion Gap: 10 mEq/L (ref 3–11)
BUN: 6.3 mg/dL — AB (ref 7.0–26.0)
CHLORIDE: 105 meq/L (ref 98–109)
CO2: 25 mEq/L (ref 22–29)
Calcium: 10.2 mg/dL (ref 8.4–10.4)
Creatinine: 0.8 mg/dL (ref 0.6–1.1)
EGFR: 79 mL/min/{1.73_m2} — ABNORMAL LOW (ref >90)
GLUCOSE: 92 mg/dL (ref 70–140)
POTASSIUM: 3.8 meq/L (ref 3.5–5.1)
SODIUM: 139 meq/L (ref 136–145)
Total Bilirubin: 0.39 mg/dL (ref 0.20–1.20)
Total Protein: 8.9 g/dL — ABNORMAL HIGH (ref 6.4–8.3)

## 2016-03-24 LAB — IRON AND TIBC
%SAT: 30 % (ref 21–57)
IRON: 107 ug/dL (ref 41–142)
TIBC: 353 ug/dL (ref 236–444)
UIBC: 246 ug/dL (ref 120–384)

## 2016-03-24 LAB — FERRITIN: FERRITIN: 99 ng/mL (ref 9–269)

## 2016-03-24 LAB — CHCC SATELLITE - SMEAR

## 2016-03-25 LAB — KAPPA/LAMBDA LIGHT CHAINS
Ig Kappa Free Light Chain: 17 mg/L (ref 3.3–19.4)
Ig Lambda Free Light Chain: 17.1 mg/L (ref 5.7–26.3)
Kappa/Lambda FluidC Ratio: 0.99 (ref 0.26–1.65)

## 2016-03-25 LAB — IGG, IGA, IGM
IGA/IMMUNOGLOBULIN A, SERUM: 214 mg/dL (ref 87–352)
IGM (IMMUNOGLOBIN M), SRM: 118 mg/dL (ref 26–217)
IgG, Qn, Serum: 996 mg/dL (ref 700–1600)

## 2016-03-28 LAB — PROTEIN ELECTROPHORESIS, SERUM, WITH REFLEX
A/G RATIO SPE: 1 (ref 0.7–1.7)
Albumin: 3.7 g/dL (ref 2.9–4.4)
Alpha 1: 0.3 g/dL (ref 0.0–0.4)
Alpha 2: 0.9 g/dL (ref 0.4–1.0)
BETA: 1.3 g/dL (ref 0.7–1.3)
GAMMA GLOBULIN: 1.1 g/dL (ref 0.4–1.8)
GLOBULIN, TOTAL: 3.6 g/dL (ref 2.2–3.9)
Total Protein: 7.3 g/dL (ref 6.0–8.5)

## 2016-03-29 ENCOUNTER — Telehealth: Payer: Self-pay | Admitting: *Deleted

## 2016-03-29 NOTE — Telephone Encounter (Signed)
Patient is cancelling her bone marrow appointment. She currently is dealing with kidney stones and states she cannot do both. She will reschedule after her kidney stones pass.  Dr Marin Olp notified

## 2016-04-04 ENCOUNTER — Ambulatory Visit (HOSPITAL_COMMUNITY): Payer: Medicare Other

## 2017-02-17 IMAGING — CT CT ABD-PELV W/O CM
2 of 4 series · 15 of 46 positions shown, 17 images · non-contrast
Comparison: None.

CLINICAL DATA: Acute onset of lower abdominal pain radiating to the
back. Nausea, vomiting and chills. Leukocytosis. Elevated lipase.
Red blood cells and white blood cells in the urine. Initial
encounter.

EXAM:
CT ABDOMEN AND PELVIS WITHOUT CONTRAST
TECHNIQUE: Multidetector CT imaging of the abdomen and pelvis was performed
following the standard protocol without IV contrast.

[Series 2: abd/pel w/o · axial · non-contrast · 0.64mm/px · z∈[+1182,+1542]mm · 12 of 86 slices shown, 14 images]
[im 7/86  soft-tissue]
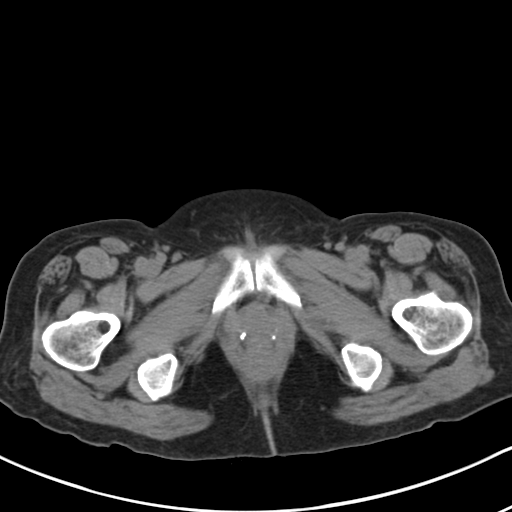
[im 7/86  bone]
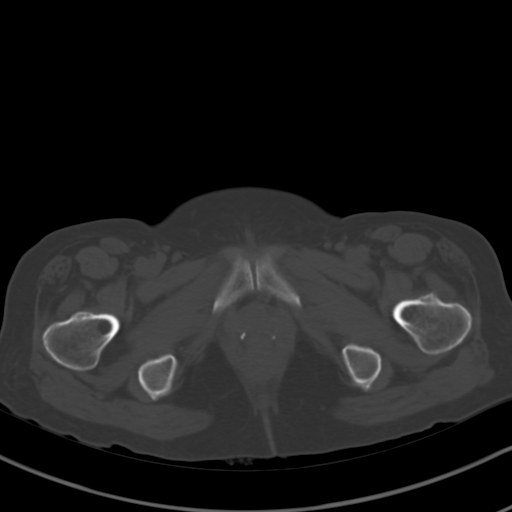
[im 14/86  soft-tissue]
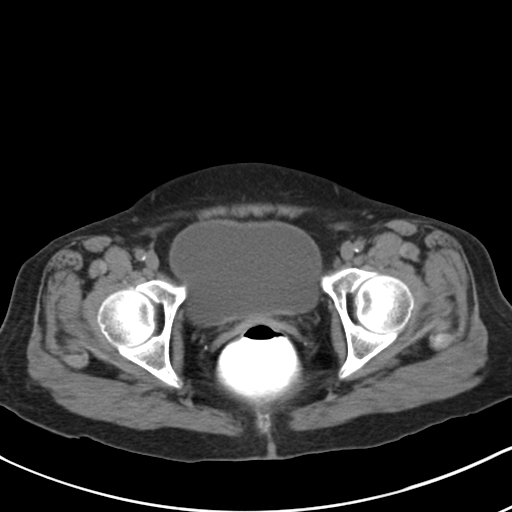
[im 20/86  soft-tissue]
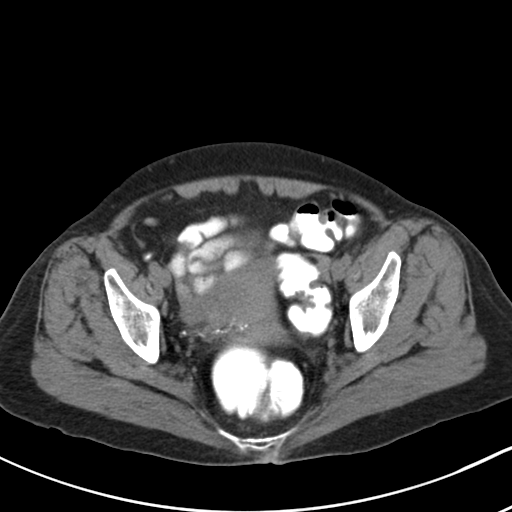
[im 27/86  soft-tissue]
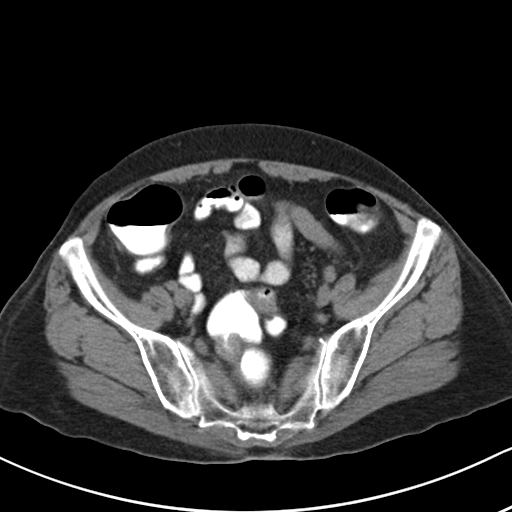
[im 33/86  soft-tissue]
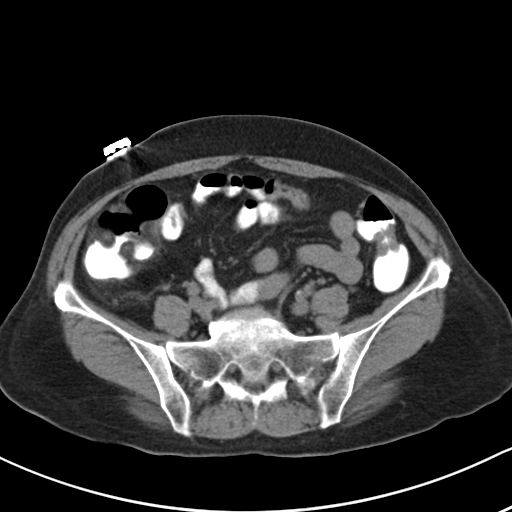
[im 40/86  soft-tissue]
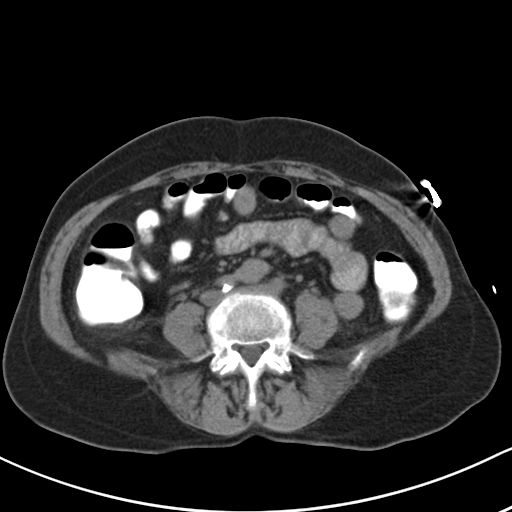
[im 46/86  soft-tissue]
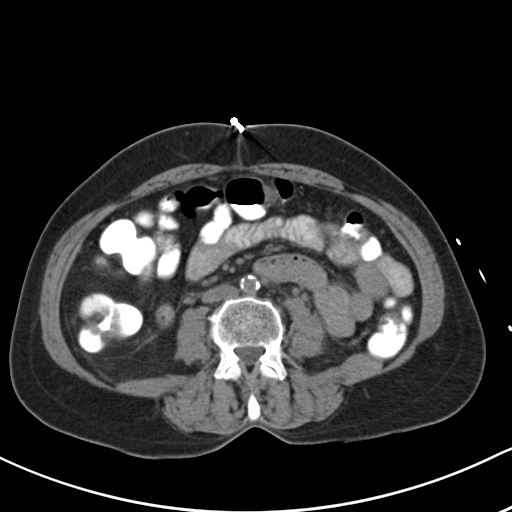
[im 53/86  soft-tissue]
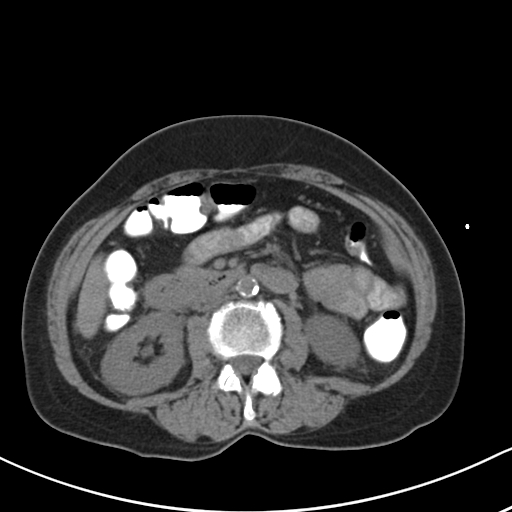
[im 59/86  soft-tissue]
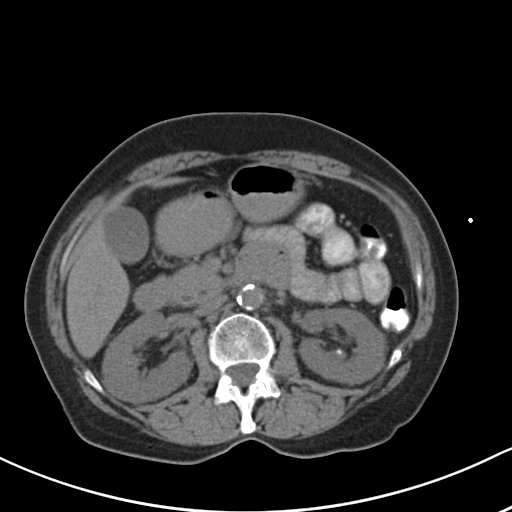
[im 59/86  bone]
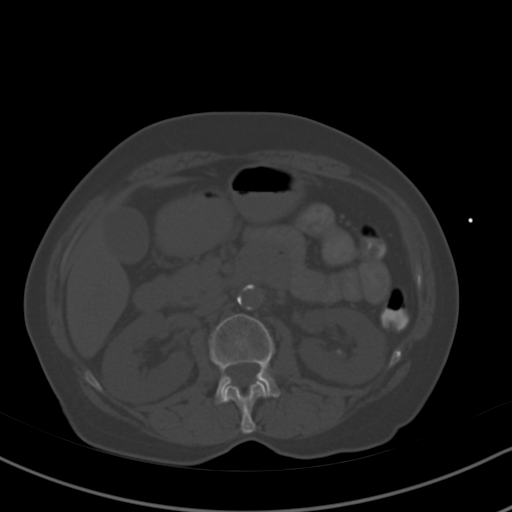
[im 66/86  soft-tissue]
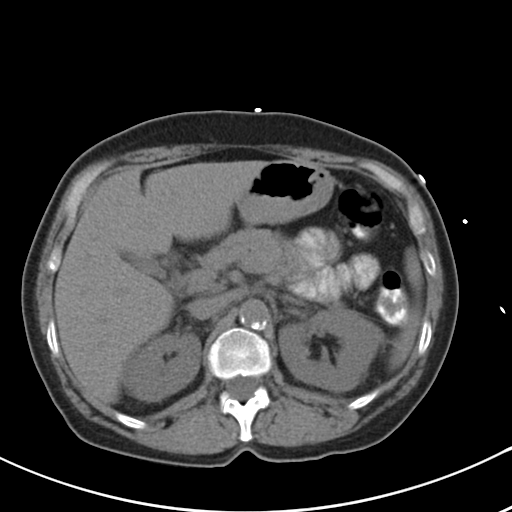
[im 72/86  soft-tissue]
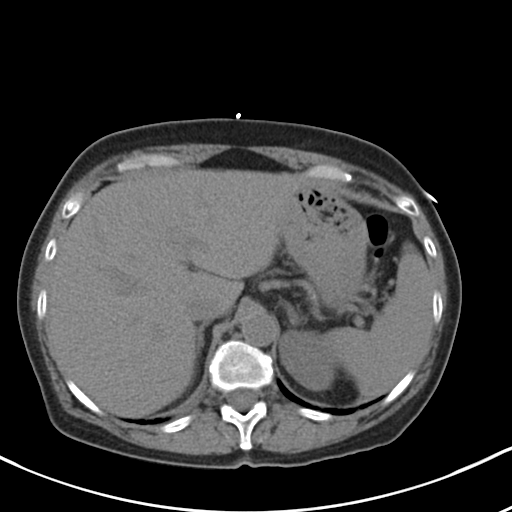
[im 79/86  soft-tissue]
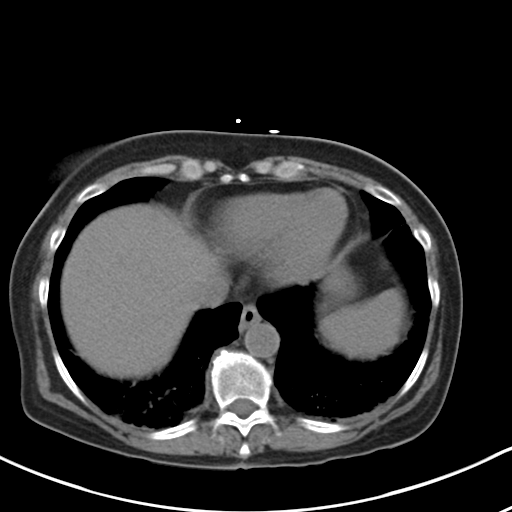

[Series 4: coronal · coronal · 0.57mm/px · 3 of 69 slices shown]
[im 23/69  soft-tissue]
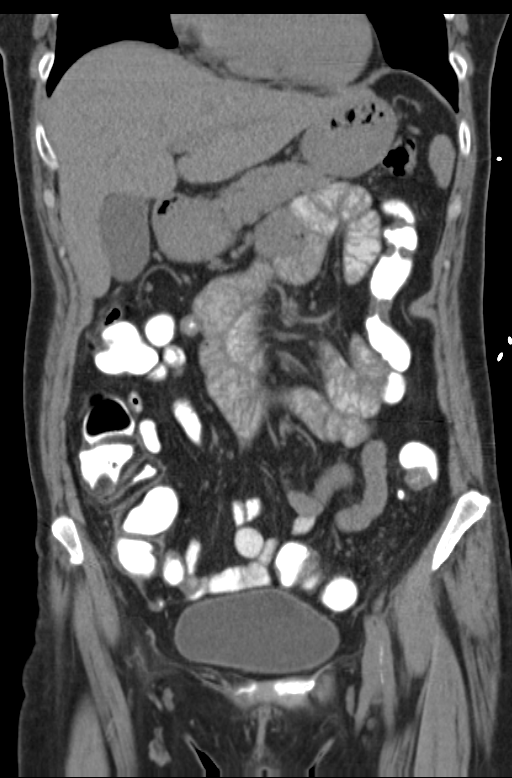
[im 31/69  soft-tissue]
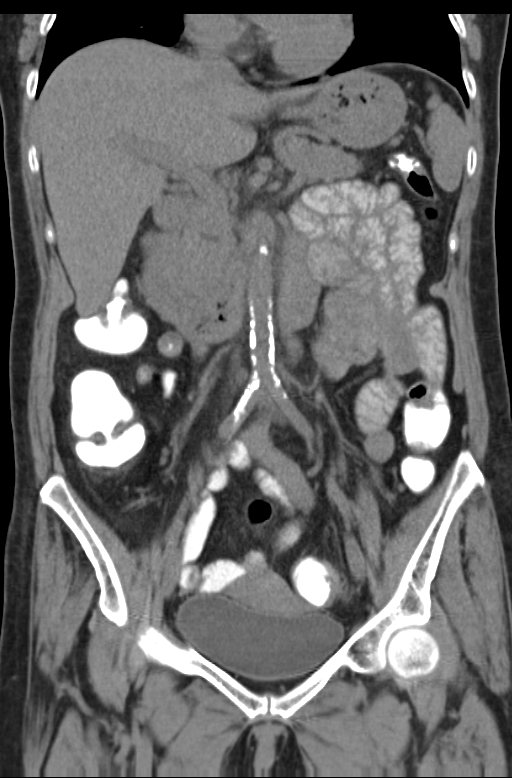
[im 38/69  soft-tissue]
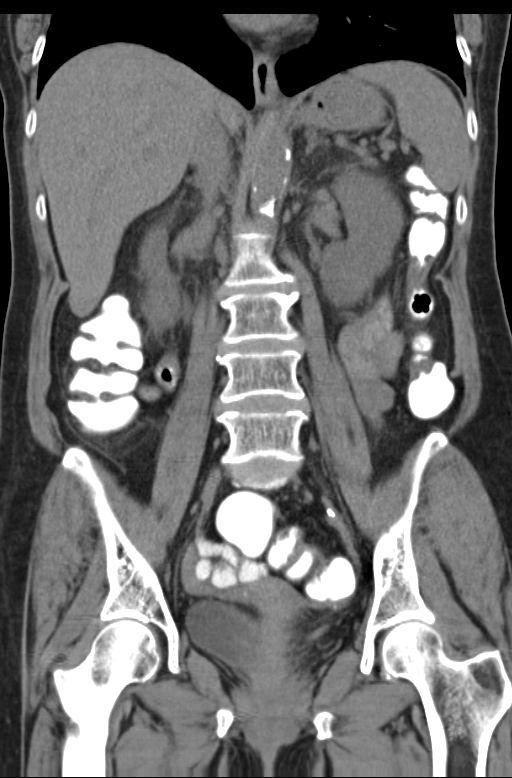

[15 of 46 positions shown; findings below may reference images not displayed]

FINDINGS: Mild bibasilar atelectasis is noted.

The liver and spleen are unremarkable in appearance. The gallbladder
is within normal limits. The pancreas and adrenal glands are
unremarkable.

Mild nonspecific perinephric stranding is noted bilaterally. A
nonobstructing 4 mm stone is noted at the interpole region of the
left kidney. The kidneys are otherwise unremarkable. There is no
evidence of hydronephrosis. No obstructing ureteral stones are seen.

No free fluid is identified. The small bowel is unremarkable in
appearance. The stomach is within normal limits. No acute vascular
abnormalities are seen. Diffuse calcification is noted along the
abdominal aorta and its branches.

The appendix is normal in caliber and contains contrast, without
evidence of appendicitis. Contrast progresses to the level of the
rectum. The colon is unremarkable in appearance.

The bladder is mildly distended and grossly unremarkable. The uterus
is within normal limits. The ovaries are grossly symmetric. No
suspicious adnexal masses are seen. No inguinal lymphadenopathy is
seen.

No acute osseous abnormalities are identified.
IMPRESSION: 1. No definite CT findings to suggest pancreatitis, though the
patient does have an elevated lipase. Would continue following the
patient's symptoms and lab values.
2. Diffuse calcification along the abdominal aorta and its branches.
3. Nonobstructing 4 mm stone at the interpole region of the left
kidney.
4. Mild bibasilar atelectasis noted.

## 2018-11-12 ENCOUNTER — Ambulatory Visit: Payer: Self-pay | Admitting: Nurse Practitioner

## 2018-11-14 ENCOUNTER — Encounter: Payer: Self-pay | Admitting: Family

## 2018-11-16 ENCOUNTER — Ambulatory Visit (INDEPENDENT_AMBULATORY_CARE_PROVIDER_SITE_OTHER): Payer: Medicare Other | Admitting: Family

## 2018-11-16 ENCOUNTER — Encounter: Payer: Self-pay | Admitting: Family

## 2018-11-16 ENCOUNTER — Other Ambulatory Visit: Payer: Self-pay

## 2018-11-16 VITALS — BP 130/70 | HR 76 | Temp 98.4°F | Ht 59.0 in | Wt 111.0 lb

## 2018-11-16 DIAGNOSIS — F411 Generalized anxiety disorder: Secondary | ICD-10-CM | POA: Diagnosis not present

## 2018-11-16 DIAGNOSIS — E782 Mixed hyperlipidemia: Secondary | ICD-10-CM | POA: Diagnosis not present

## 2018-11-16 DIAGNOSIS — K52831 Collagenous colitis: Secondary | ICD-10-CM

## 2018-11-16 DIAGNOSIS — R7303 Prediabetes: Secondary | ICD-10-CM

## 2018-11-16 DIAGNOSIS — C9112 Chronic lymphocytic leukemia of B-cell type in relapse: Secondary | ICD-10-CM

## 2018-11-16 DIAGNOSIS — F329 Major depressive disorder, single episode, unspecified: Secondary | ICD-10-CM

## 2018-11-16 DIAGNOSIS — I10 Essential (primary) hypertension: Secondary | ICD-10-CM | POA: Diagnosis not present

## 2018-11-16 DIAGNOSIS — F32A Depression, unspecified: Secondary | ICD-10-CM

## 2018-11-16 DIAGNOSIS — C9192 Lymphoid leukemia, unspecified, in relapse: Secondary | ICD-10-CM

## 2018-11-16 DIAGNOSIS — M359 Systemic involvement of connective tissue, unspecified: Secondary | ICD-10-CM | POA: Insufficient documentation

## 2018-11-16 MED ORDER — VITAMIN D (ERGOCALCIFEROL) 1.25 MG (50000 UNIT) PO CAPS: 50000.0000 [IU] | ORAL_CAPSULE | ORAL | 3 refills | Status: AC

## 2018-11-16 MED ORDER — ROSUVASTATIN CALCIUM 10 MG PO TABS: 10.0000 mg | ORAL_TABLET | Freq: Every day | ORAL | 3 refills | Status: DC

## 2018-11-16 MED ORDER — ONDANSETRON 4 MG PO TBDP: 8.0000 mg | ORAL_TABLET | Freq: Two times a day (BID) | ORAL | 0 refills | Status: DC

## 2018-11-16 NOTE — Progress Notes (Signed)
Provider: Marlowe Sax FNP-C   Joceline Hinchcliff, Nelda Bucks, NP  Patient Care Team: Tamaira Ciriello, Nelda Bucks, NP as PCP - General (Family Medicine)  Extended Emergency Contact Information Primary Emergency Contact: [facility] Address: [address]          [place], [place] Phone: [phone] Work Phone: [phone] Relation: Other Secondary Emergency Contact: [place] Phone: [phone] Relation: Relative   Goals of care: Advanced Directive information Advanced Directives 11/16/2018  Does Patient Have a Medical Advance Directive? No  Type of Advance Directive -  Does patient want to make changes to medical advance directive? -  Copy of [place] in Chart? -  Would patient like information on creating a medical advance directive? No - Patient declined     Chief Complaint  Patient presents with  . Establish Care    New Patient    HPI:  Pt is a 68 y.o. female seen today to establish care at the practice for medical management of chronic diseases.she was previous following up with [facility] internal Medicine in [place] with Dr. Donley Redder.  Collagenous colitis - Has 5-15 episodes of diarrhea per day with associated symptoms of bloating,abdominal distension,tenderness.currently on dicyclomine 20 mg tablet three times dailily as needed but states ineffective.she denies any weight loss.states needs referral to gastroenterology.Per previous PCP was referred to GI but patient was dismissed from practice.    Barrett's Esophagus - she denies any difficulties swallowing.states needs referral to gastroenterology.  Prediabetes - states Hgb A1C was previous ordered but not done.   IBS with Diarrhea -chronic problem.states " food just goes through even plain water".Has x 2 episodes during visit.  HTN-she does not check her blood pressure at home.latest ECHO done in 2011 showed  LVH.she denies any cough,wheezing,shortness of  breath,weight gain or edema.   Generalized anxiety /Panic attack - follows up with mental health provider in [place].on Alprazolam twice daily.  Seasonal affective disorder- stable currently but states worst during the winter.she follows up with mental health provider.on Sertraline 200 mg daily. Depression- states  CLL- Has had elevated WBC 13.9 followed by hematology  Nephrolithiais - states had several kidney stones.follows up with urologist.on Tamsulosin 0.4 mg tablet daily.  Hyperlipidemia - states allergic to statins except tolerates Crestor.she does not exercise.states eats one meal a day which passes through her due to IBS and colitis.   Smoker- she smokes 1 pk /daydoes not think she can quit smoking especially with her current GI symptoms. She thinks smoking could be irritating her stomach too.       Past Medical History:  Diagnosis Date  . Anemia   . Anxiety   . Arthritis   . Barrett esophagus   . Collagenous colitis   . Depression   . Diverticulosis   . Dumping syndrome   . Gastritis, acute   . 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 Hyperlipidemia   . Hypertension   . Leukocytosis    felt to be secondary to collagenous colitis; sees Dr Leontine Locket.  . Lymph edema    around liver  . Other abnormal clinical finding 2009   rapid gastric emptying  . Pancreatic insufficiency 02/20/08   as per stool elastase results   Past Surgical History:  Procedure Laterality Date  . TONSILLECTOMY    . TUBAL LIGATION      Allergies  Allergen Reactions  . Atorvastatin Other (See Comments)    achey  .  Atropine Other (See Comments)    Causes huge pupils for days  . Codeine Other (See Comments)    Skin crawls, makes crazy  . Ketorolac Tromethamine Other (See Comments)    Skin crawls, makes crazy  . Meperidine Hcl     All opiates  . Simvastatin Nausea Only    Achey  . Valtrex [Valacyclovir Hcl] Other (See Comments)    "Kidneys shut down"    Allergies as  of 11/16/2018      Reactions   Atorvastatin Other (See Comments)   achey   Atropine Other (See Comments)   Causes huge pupils for days   Codeine Other (See Comments)   Skin crawls, makes crazy   Ketorolac Tromethamine Other (See Comments)   Skin crawls, makes crazy   Meperidine Hcl    All opiates   Simvastatin Nausea Only   Achey   Valtrex [valacyclovir Hcl] Other (See Comments)   "Kidneys shut down"      Medication List       Accurate as of November 16, 2018  1:45 PM. If you have any questions, ask your nurse or doctor.        ALPRAZolam 1 MG tablet Commonly known as: XANAX Take 1 tablet by mouth 2 (two) times a day.   aspirin EC 81 MG tablet Take 1 tablet by mouth daily.   dicyclomine 20 MG tablet Commonly known as: BENTYL Take 1 tablet by mouth every 8 (eight) hours as needed.   ondansetron 4 MG disintegrating tablet Commonly known as: ZOFRAN-ODT Take 8 mg by mouth 2 (two) times a day.   rosuvastatin 10 MG tablet Commonly known as: CRESTOR Take 1 tablet by mouth daily.   sertraline 100 MG tablet Commonly known as: ZOLOFT Take 2 tablets (200 mg total) by mouth daily.   tamsulosin 0.4 MG Caps capsule Commonly known as: FLOMAX Take 1 capsule by mouth daily.   TYLENOL ARTHRITIS PAIN PO Take 1 tablet by mouth daily.   Vitamin D (Ergocalciferol) 1.25 MG (50000 UT) Caps capsule Commonly known as: DRISDOL Take 1 capsule by mouth once a week.       Review of Systems  Constitutional: Negative for chills, fatigue and fever.       Poor appetite  HENT: Negative for congestion, rhinorrhea, sinus pressure, sinus pain, sneezing, sore throat and trouble swallowing.   Eyes: Negative for discharge, redness, itching and visual disturbance.  Respiratory: Negative for cough, chest tightness, shortness of breath and wheezing.   Cardiovascular: Negative for chest pain, palpitations and leg swelling.  Gastrointestinal: Positive for abdominal pain and diarrhea. Negative for  blood in stool, constipation, nausea and vomiting.       Watery,Mucus with undigested food.bloating " feels like knivies" diarrhea 9-15 times a day even with eating one meal a day.   Endocrine: Negative for cold intolerance, heat intolerance, polydipsia, polyphagia and polyuria.  Genitourinary: Negative for difficulty urinating, dysuria, flank pain, frequency and urgency.  Musculoskeletal: Negative for gait problem.  Skin: Negative for color change, pallor, rash and wound.  Neurological: Negative for dizziness, syncope, weakness, light-headedness, numbness and headaches.  Hematological: Does not bruise/bleed easily.  Psychiatric/Behavioral: Negative for sleep disturbance. The patient is nervous/anxious.        Sees a counselor for panic attack in [place]    Immunization History  Administered Date(s) Administered  . Tdap 09/19/2011   Pertinent  Health Maintenance Due  Topic Date Due  . MAMMOGRAM  04/06/2013  . DEXA SCAN  01/01/2015  .  PNA vac Low Risk Adult (1 of 2 - PCV13) 01/01/2015  . INFLUENZA VACCINE  12/22/2018  . COLONOSCOPY  11/22/2020   Fall Risk  11/16/2018  Falls in the past year? 1  Number falls in past yr: 1  Injury with Fall? 0    Vitals:   11/16/18 1326  BP: 130/70  Pulse: 76  Temp: 98.4 F (36.9 C)  TempSrc: Oral  SpO2: 96%  Weight: 111 lb (50.3 kg)  Height: 4' 11"  (1.499 m)   Body mass index is 22.42 kg/m. Physical Exam Constitutional:      General: She is not in acute distress.    Appearance: She is normal weight. She is not ill-appearing.  HENT:     Head: Normocephalic.     Right Ear: Tympanic membrane, ear canal and external ear normal. There is no impacted cerumen.     Left Ear: Tympanic membrane and ear canal normal. There is no impacted cerumen.     Nose: Nose normal. No congestion or rhinorrhea.     Mouth/Throat:     Mouth: Mucous membranes are moist.     Pharynx: Oropharynx is clear. No oropharyngeal exudate or posterior oropharyngeal  erythema.  Eyes:     General: No scleral icterus.       Right eye: No discharge.        Left eye: No discharge.     Conjunctiva/sclera: Conjunctivae normal.     Pupils: Pupils are equal, round, and reactive to light.  Neck:     Musculoskeletal: Normal range of motion. No neck rigidity or muscular tenderness.     Vascular: No carotid bruit.  Cardiovascular:     Rate and Rhythm: Normal rate and regular rhythm.     Pulses: Normal pulses.     Heart sounds: Normal heart sounds. No murmur. No friction rub. No gallop.   Pulmonary:     Effort: Pulmonary effort is normal. No respiratory distress.     Breath sounds: Normal breath sounds. No wheezing, rhonchi or rales.  Chest:     Chest wall: No tenderness.  Abdominal:     General: Bowel sounds are normal. There is no distension.     Palpations: Abdomen is soft. There is no mass.     Tenderness: There is no right CVA tenderness, left CVA tenderness, guarding or rebound.     Comments: Epigastric,mid -umbilicus and lower abdominal quadrant tenderness   Lymphadenopathy:     Cervical: No cervical adenopathy.  Neurological:     Mental Status: She is alert and oriented to person, place, and time.     Cranial Nerves: No cranial nerve deficit.     Sensory: No sensory deficit.     Motor: No weakness.     Coordination: Coordination normal.     Gait: Gait normal.  Psychiatric:        Mood and Affect: Mood is anxious.     Labs reviewed: No results for input(s): NA, K, CL, CO2, GLUCOSE, BUN, CREATININE, CALCIUM, MG, PHOS in the last 8760 hours. No results for input(s): AST, ALT, ALKPHOS, BILITOT, PROT, ALBUMIN in the last 8760 hours. No results for input(s): WBC, NEUTROABS, HGB, HCT, MCV, PLT in the last 8760 hours. Lab Results  Component Value Date   TSH 1.107 08/17/2011   Lab Results  Component Value Date   HGBA1C  04/03/2010    5.6 (NOTE)  According to the ADA Clinical  Practice Recommendations for 2011, when HbA1c is used as a screening test:   >=6.5%   Diagnostic of Diabetes Mellitus           (if abnormal result  is confirmed)  5.7-6.4%   Increased risk of developing Diabetes Mellitus  References:Diagnosis and Classification of Diabetes Mellitus,Diabetes VLDK,4461,90(VQQUI 1):S62-S69 and Standards of Medical Care in         Diabetes - 2011,Diabetes VHOY,4314,27  (Suppl 1):S11-S61.   Lab Results  Component Value Date   CHOL 158 08/17/2011   HDL 45 08/17/2011   LDLCALC 81 08/17/2011   TRIG 161 (H) 08/17/2011   CHOLHDL 3.5 08/17/2011    Significant Diagnostic Results in last 30 days:  No results found.  Assessment/Plan 1. Essential Hypertension B/p reviewed at goal.off meds. - CBC with Differential/Platelet; Future - CMP with eGFR([facility]); Future  2. Mixed hyperlipidemia Continue on Crestor 10 mg tablet daily. - Lipid panel; Future  3. Collagenous colitis Has abdominal tenderness on palpation. Continue on dicyclomine 20 mg tablet three times daily. No colonoscopy for evaluation. - Ambulatory referral to Gastroenterology  4. Generalized anxiety disorder Anxious during visit.continue on Xanax. Continue to follow up with mental health provider.  5. Depression, unspecified depression type Mood stable.continue on sertraline 200 mg tablet daily.Continue to follow up with Psychiatry service.  - TSH; Future  6. 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 - CBC with Differential/Platelet; Future  7. Prediabetes Discussed with patient low concentrated sweets/snack/drinks but states eat one meal daily due to her chronic diarrhea.   - Hgb A1c; Future.  Family/ staff Communication: Reviewed plan of care with patient.   Labs/tests ordered:  - CBC with Differential/Platelet; Future - CMP with eGFR([facility]); Future - Lipid panel; Future - Hgb A1c; Future.  Time spent with patient 45 minutes  >50% time spent counseling; reviewing medical record; tests; labs; and developing future plan of care  Sandrea Hughs, NP

## 2018-12-11 ENCOUNTER — Other Ambulatory Visit: Payer: Medicare Other

## 2019-02-20 ENCOUNTER — Other Ambulatory Visit: Payer: Self-pay | Admitting: Family

## 2019-02-20 NOTE — Telephone Encounter (Signed)
Allergy/contraindication notation populated with attempt to refill.  Nausea reported with other statins (simvastatin and atorvastatin).

## 2019-03-20 ENCOUNTER — Other Ambulatory Visit: Payer: Self-pay

## 2019-03-20 MED ORDER — ROSUVASTATIN CALCIUM 10 MG PO TABS: ORAL_TABLET | ORAL | 1 refills | Status: DC

## 2019-03-20 NOTE — Telephone Encounter (Signed)
Patient called requesting refill for crestor. High allergy / contraindication alert routing to provider for approval.

## 2019-05-21 ENCOUNTER — Ambulatory Visit: Payer: Medicare Other | Admitting: Family

## 2019-05-22 ENCOUNTER — Ambulatory Visit: Payer: Medicare Other | Admitting: Nurse Practitioner

## 2019-05-30 ENCOUNTER — Ambulatory Visit: Payer: Medicare Other | Admitting: Family

## 2019-07-22 ENCOUNTER — Telehealth (INDEPENDENT_AMBULATORY_CARE_PROVIDER_SITE_OTHER): Payer: Medicare Other | Admitting: Nurse Practitioner

## 2019-07-22 ENCOUNTER — Encounter: Payer: Self-pay | Admitting: Nurse Practitioner

## 2019-07-22 ENCOUNTER — Other Ambulatory Visit: Payer: Self-pay

## 2019-07-22 VITALS — Temp 97.3°F

## 2019-07-22 DIAGNOSIS — Z87891 Personal history of nicotine dependence: Secondary | ICD-10-CM | POA: Diagnosis not present

## 2019-07-22 DIAGNOSIS — R059 Cough, unspecified: Secondary | ICD-10-CM

## 2019-07-22 DIAGNOSIS — K22719 Barrett's esophagus with dysplasia, unspecified: Secondary | ICD-10-CM

## 2019-07-22 DIAGNOSIS — R05 Cough: Secondary | ICD-10-CM

## 2019-07-22 NOTE — Progress Notes (Signed)
This service is provided via telemedicine  Vital signs collected/recorded were taken by patient at home  Location of patient (ex: home, work):  Home   Patient consents to a telephone visit:  Yes  Location of the provider (ex: office, home): [facility], Office   Name of any referring provider:  N/A  Names of all persons participating in the telemedicine service and their role in the encounter:  Wendy Houston, Wendy Mustache, NP, and Wendy Houston, Patient  Time spent on call:  9 min with medical assistant     Careteam: Patient Care Team: Wendy Chandler, NP as PCP - General (Geriatric Medicine)  Advanced Directive information    Allergies  Allergen Reactions  . Atorvastatin Other (See Comments)    achey  . Atropine Other (See Comments)    Causes huge pupils for days  . Codeine Other (See Comments)    Skin crawls, makes crazy  . Ketorolac Tromethamine Other (See Comments)    Skin crawls, makes crazy  . Meperidine Hcl     All opiates  . Simvastatin Nausea Only    Achey  . Valtrex [Valacyclovir Hcl] Other (See Comments)    "Kidneys shut down"    Chief Complaint  Patient presents with  . Acute Visit    Chills, breathing issues, deep cough, and weakness. Telehealth/Video Visit      HPI: Patient is a 69 y.o. female due to not feeling well.   Reports 2 weeks ago she had endoscopy and colonoscopy.   She did okay after this but then reports she had bronchitis like symptoms but having chills.  Reports her temperature has been low.  Reports she feels so sick and does not feel like walking across the floor.  Reports her throat is sore.  Also reports the cough is deep and feels like her lungs are burning. When she is having bad coughing spells she has a hard time getting her breath.  No nasal congestion but feels like her right ear feels like she is getting an ear ache and stopped up.  No runny nose.  Dry cough. No chest congestion.  No chest pain.    Sore throat and will lose her voice during the day.  Just feels sick Hx of smoking- reports she quit smoking 4 weeks ago.   Has had a time with her pancreas and having chronic diarrhea. Reports she has lost a lot of weight and has hx sa  Review of Systems:  Review of Systems  Constitutional: Positive for chills and malaise/fatigue. Negative for fever and weight loss.  HENT: Positive for sore throat ( ). Negative for ear pain, hearing loss and tinnitus.   Respiratory: Positive for cough. Negative for hemoptysis, sputum production, shortness of breath and wheezing.   Cardiovascular: Negative for chest pain, palpitations and leg swelling.  Gastrointestinal: Positive for diarrhea.  Musculoskeletal: Positive for myalgias.  Neurological: Positive for weakness.    Past Medical History:  Diagnosis Date  . Anemia   . Anxiety   . Arthritis   . Barrett esophagus   . Collagenous colitis   . Depression   . Diverticulosis   . Dumping syndrome   . Gastritis, acute   . 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 Hyperlipidemia   . Hypertension   . Leukocytosis    felt to be secondary to collagenous colitis; sees Dr Leontine Locket.  . Lymph edema    around liver  . Other abnormal  clinical finding 2009   rapid gastric emptying  . Pancreatic insufficiency 02/20/08   as per stool elastase results   Past Surgical History:  Procedure Laterality Date  . TONSILLECTOMY    . TUBAL LIGATION     Social History:   reports that she quit smoking about 4 weeks ago. Her smoking use included cigarettes. She has never used smokeless tobacco. She reports previous alcohol use. She reports that she does not use drugs.  Family History  Problem Relation Age of Onset  . Breast cancer Mother   . Diabetes Mother   . Hyperlipidemia Mother   . Hypertension Mother   . Ulcers Father   . Colitis Father   . Testicular cancer Son   . Melanoma Son        arm  . Diabetes Son   . Diabetes Maternal  Grandmother     Medications: Patient's Medications  New Prescriptions   No medications on file  Previous Medications   ACETAMINOPHEN (TYLENOL ARTHRITIS PAIN PO)    Take 1 tablet by mouth as needed.    ALPRAZOLAM (XANAX) 1 MG TABLET    Take 1 tablet by mouth 2 (two) times a day.   BUSPIRONE (BUSPAR) 10 MG TABLET    Take 10 mg by mouth 2 (two) times daily.   CHOLECALCIFEROL (VITAMIN D3) 50 MCG (2000 UT) TABS    Take 1 tablet by mouth daily.   ONDANSETRON (ZOFRAN-ODT) 4 MG DISINTEGRATING TABLET    Take 2 tablets (8 mg total) by mouth 2 (two) times a day.   PANCRELIPASE, LIP-PROT-AMYL, 24000-76000 UNITS CPEP    Take 1 capsule by mouth in the morning, at noon, and at bedtime.   ROSUVASTATIN (CRESTOR) 10 MG TABLET    TAKE 1 TABLET BY MOUTH EVERY DAY FOR CHOLESTEROL   TAMSULOSIN (FLOMAX) 0.4 MG CAPS CAPSULE    Take 1 capsule by mouth as needed.    ZINC GLUCONATE 50 MG TABLET    Take 50 mg by mouth daily.  Modified Medications   No medications on file  Discontinued Medications   ASPIRIN EC 81 MG TABLET    Take 1 tablet by mouth daily.   DICYCLOMINE (BENTYL) 20 MG TABLET    Take 1 tablet by mouth every 8 (eight) hours as needed.   SERTRALINE (ZOLOFT) 100 MG TABLET    Take 2 tablets (200 mg total) by mouth daily.    Physical Exam:  Vitals:   07/22/19 1345  Temp: (!) 97.3 F (36.3 C)  TempSrc: Oral   There is no height or weight on file to calculate BMI. Wt Readings from Last 3 Encounters:  11/16/18 111 lb (50.3 kg)  03/21/16 110 lb 6.4 oz (50.1 kg)  01/02/15 112 lb 3.2 oz (50.9 kg)     Labs reviewed: Basic Metabolic Panel: No results for input(s): NA, K, CL, CO2, GLUCOSE, BUN, CREATININE, CALCIUM, MG, PHOS, TSH in the last 8760 hours. Liver Function Tests: No results for input(s): AST, ALT, ALKPHOS, BILITOT, PROT, ALBUMIN in the last 8760 hours. No results for input(s): LIPASE, AMYLASE in the last 8760 hours. No results for input(s): AMMONIA in the last 8760 hours. CBC: No  results for input(s): WBC, NEUTROABS, HGB, HCT, MCV, PLT in the last 8760 hours. Lipid Panel: No results for input(s): CHOL, HDL, LDLCALC, TRIG, CHOLHDL, LDLDIRECT in the last 8760 hours. TSH: No results for input(s): TSH in the last 8760 hours. A1C: Lab Results  Component Value Date   HGBA1C  04/03/2010  5.6 (NOTE)                                                                       According to the ADA Clinical Practice Recommendations for 2011, when HbA1c is used as a screening test:   >=6.5%   Diagnostic of Diabetes Mellitus           (if abnormal result  is confirmed)  5.7-6.4%   Increased risk of developing Diabetes Mellitus  References:Diagnosis and Classification of Diabetes Mellitus,Diabetes [id number] 1):S62-S69 and Standards of Medical Care in         Diabetes - 2011,Diabetes [id number]  (Suppl 1):S11-S61.     Assessment/Plan 1. Cough -due to the [place] pandemic will have her get tested for COVID at this time however this could be a post-viral cough. Question if it is related to some of her GI issues that she is following with GI. To continue OTC as needed for cough. Can use tylenol as needed for discomfort and stay hydrated.   2. Barrett's esophagus with dysplasia -under the care of GI, has been started on pepcid 20 mg BID.  3. Hx of smoking -could also be contributing to cough  Next appt: 4 weeks , will follow up sooner if needed  Wendy Houston K. Harle Battiest  [facility] [phone]   Virtual Visit via Video Note  I connected with Wendy Houston on 07/23/19 at  1:30 PM EST by a video enabled telemedicine application and verified that I am speaking with the correct person using two identifiers.  Location: Patient: home Provider: office   I discussed the limitations of evaluation and management by telemedicine and the availability of in person appointments. The patient expressed understanding and agreed to proceed.    I  discussed the assessment and treatment plan with the patient. The patient was provided an opportunity to ask questions and all were answered. The patient agreed with the plan and demonstrated an understanding of the instructions.   The patient was advised to call back or seek an in-person evaluation if the symptoms worsen or if the condition fails to improve as anticipated.  I provided 18 minutes of non-face-to-face time during this encounter.  Carlos American. Dewaine Oats, AGNP Avs printed and mailed.

## 2019-07-22 NOTE — Patient Instructions (Addendum)
-  educated to go online to schedule appt for COVID testing through [email or web address] -make sure you are staying well hydrated -recommended to continue to take Vit C 1000 mg twice daily, Vit D 5000 units, zinc 50 mg daily  Tylenol 325 mg 2 tablets every 6 hours as needed for sore throat/body aches/fever/headaches  MUCINEX DM by mouth twice daily with full of water

## 2019-07-29 ENCOUNTER — Telehealth (INDEPENDENT_AMBULATORY_CARE_PROVIDER_SITE_OTHER): Payer: Medicare Other | Admitting: Family

## 2019-07-29 ENCOUNTER — Encounter: Payer: Self-pay | Admitting: Family

## 2019-07-29 ENCOUNTER — Other Ambulatory Visit: Payer: Self-pay

## 2019-07-29 DIAGNOSIS — R058 Other specified cough: Secondary | ICD-10-CM

## 2019-07-29 DIAGNOSIS — R05 Cough: Secondary | ICD-10-CM

## 2019-07-29 MED ORDER — PROBIOTIC 250 MG PO CAPS: 250.0000 | ORAL_CAPSULE | Freq: Two times a day (BID) | ORAL | 0 refills | Status: AC

## 2019-07-29 MED ORDER — AZITHROMYCIN 250 MG PO TABS: ORAL_TABLET | ORAL | 0 refills | Status: DC

## 2019-07-29 NOTE — Progress Notes (Signed)
This service is provided via telemedicine  No vital signs collected/recorded due to the encounter was a telemedicine visit.   Location of patient (ex: home, work):  Home  Patient consents to a telephone visit:  Yes  Location of the provider (ex: office, home):  [place]  Name of any referring provider:  N/A  Names of all persons participating in the telemedicine service and their role in the encounter:  Patient, Heriberto Antigua, [place], [place], Webb Silversmith, NP.    Time spent on call: 8 minutes on the phone with Medical Assistant.   Provider: Shirel Mallis FNP-C  Lauree Chandler, NP  Patient Care Team: Lauree Chandler, NP as PCP - General (Geriatric Medicine)  Extended Emergency Contact Information Primary Emergency Contact: [facility] Address: [address]          [place], [place] Home Phone: [phone] Work Phone: [phone] Relation: Other Secondary Emergency Contact: [place] [facility] [place] Phone: [phone] Relation: Relative  Code Status:  Full Code  Goals of care: Advanced Directive information Advanced Directives 07/29/2019  Does Patient Have a Medical Advance Directive? Yes  Type of Advance Directive Living will;Healthcare Power of [place];Out of facility DNR (pink MOST or yellow form)  Does patient want to make changes to medical advance directive? No - Patient declined  Copy of [place] in Chart? No - copy requested  Would patient like information on creating a medical advance directive? -     Chief Complaint  Patient presents with  . Acute Visit    Cough not improving    HPI:  Pt is a 69 y.o. female seen today for an acute visit for evaluation of cough x 9 days.Has had cough since 07/21/2019.she was seen by PCP Cassandria Anger on 07/22/2019 for cough and was advised to get tested for COVID-19.she states got tested for COVID-19 results were negative.she was also seen at [facility] in [place] was given Augmentin 875-125 mg tablet to take twice daily x 10 days.Also given codeine cough syrup but has not taken because she forgot was allergic to Codeine.Her son who is going to school to be a medical Doctor reminded her that she is allergic to codeine.she states Augmentin is not helping her cough.Has had a 5 days course already without any improvement.she describe cough as deep non-productive keeping her and Husband awake at night.she denies any wheezing or shortness of breath.states achiness has improved but continues to have chills but no fever Temp today was 97.3  Has poor appetite but has been drinking plenty of fluid,soups and orange juice. States has had similar cough in the past but not as bad as this.Z-pak works better for her.Discussed getting a chest X-ray but states would like to try Z-pak since it's worked in the past.she has a history of smoking but has not smoked recently " can't smoke with such bad cough".   Past Medical History:  Diagnosis Date  . Anemia   . Anxiety   . Arthritis   . Barrett esophagus   . Collagenous colitis   . Depression   . Diverticulosis   . Dumping syndrome   . Gastritis, acute   . 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 Hyperlipidemia   . Hypertension   . Leukocytosis    felt to be secondary to collagenous colitis; sees Dr Leontine Locket.  . Lymph edema    around liver  . Other abnormal clinical finding 2009  rapid gastric emptying  . Pancreatic insufficiency 02/20/08   as per stool elastase results   Past Surgical History:  Procedure Laterality Date  . TONSILLECTOMY    . TUBAL LIGATION      Allergies  Allergen Reactions  . Atorvastatin Other (See Comments)    achey  . Atropine Other (See Comments)    Causes huge pupils for days  . Codeine Other (See Comments)    Skin crawls, makes crazy  . Ketorolac Tromethamine Other (See Comments)    Skin crawls, makes crazy  . Meperidine Hcl     All opiates   . Simvastatin Nausea Only    Achey  . Valtrex [Valacyclovir Hcl] Other (See Comments)    "Kidneys shut down"    Outpatient Encounter Medications as of 07/29/2019  Medication Sig  . Acetaminophen (TYLENOL ARTHRITIS PAIN PO) Take 1 tablet by mouth as needed.   . ALPRAZolam (XANAX) 1 MG tablet Take 1 tablet by mouth 2 (two) times a day.  . busPIRone (BUSPAR) 10 MG tablet Take 10 mg by mouth 2 (two) times daily.  . Cholecalciferol (VITAMIN D3) 50 MCG (2000 UT) TABS Take 1 tablet by mouth daily.  . ondansetron (ZOFRAN-ODT) 4 MG disintegrating tablet Take 2 tablets (8 mg total) by mouth 2 (two) times a day.  . Pancrelipase, Lip-Prot-Amyl, 24000-76000 units CPEP Take 1 capsule by mouth in the morning, at noon, and at bedtime.  . rosuvastatin (CRESTOR) 10 MG tablet TAKE 1 TABLET BY MOUTH EVERY DAY FOR CHOLESTEROL  . tamsulosin (FLOMAX) 0.4 MG CAPS capsule Take 1 capsule by mouth as needed.   . zinc gluconate 50 MG tablet Take 50 mg by mouth daily.  Marland Kitchen amoxicillin-clavulanate (AUGMENTIN) 875-125 MG tablet Take 1 tablet by mouth 2 (two) times daily.  . predniSONE (DELTASONE) 20 MG tablet Take 40 mg by mouth daily.   No facility-administered encounter medications on file as of 07/29/2019.    Review of Systems  Constitutional: Positive for appetite change and chills. Negative for fatigue, fever and unexpected weight change.  HENT: Negative for congestion, postnasal drip, rhinorrhea, sinus pressure, sinus pain, sneezing and sore throat.   Respiratory: Positive for cough. Negative for chest tightness, shortness of breath and wheezing.   Cardiovascular: Negative for chest pain, palpitations and leg swelling.  Gastrointestinal: Negative for abdominal distention, abdominal pain, constipation, nausea and vomiting.       Chronic diarrhea follows up with Gastroenterology   Skin: Negative for color change, pallor and rash.  Neurological: Negative for dizziness, light-headedness and headaches.    Psychiatric/Behavioral: Negative for agitation and confusion.       Cough keeping her awake at night     Immunization History  Administered Date(s) Administered  . Tdap 09/19/2011   Pertinent  Health Maintenance Due  Topic Date Due  . MAMMOGRAM  04/06/2013  . DEXA SCAN  01/01/2015  . PNA vac Low Risk Adult (1 of 2 - PCV13) 01/01/2015  . INFLUENZA VACCINE  12/22/2018  . COLONOSCOPY  11/22/2020   Fall Risk  07/29/2019 07/22/2019 11/16/2018  Falls in the past year? 0 0 1  Number falls in past yr: 0 0 1  Injury with Fall? 0 0 0   Functional Status Survey:    There were no vitals filed for this visit. There is no height or weight on file to calculate BMI. Physical Exam Constitutional:      General: She is not in acute distress.    Appearance: She is not ill-appearing.  HENT:     Nose: No congestion or rhinorrhea.  Eyes:     General: No scleral icterus.       Right eye: No discharge.        Left eye: No discharge.     Conjunctiva/sclera: Conjunctivae normal.  Pulmonary:     Effort: Pulmonary effort is normal. No respiratory distress.  Musculoskeletal:     Right lower leg: No edema.     Left lower leg: No edema.  Neurological:     Mental Status: She is alert and oriented to person, place, and time.  Psychiatric:        Mood and Affect: Mood normal.        Behavior: Behavior normal.        Thought Content: Thought content normal.        Judgment: Judgment normal.    Labs reviewed: No results for input(s): NA, K, CL, CO2, GLUCOSE, BUN, CREATININE, CALCIUM, MG, PHOS in the last 8760 hours. No results for input(s): AST, ALT, ALKPHOS, BILITOT, PROT, ALBUMIN in the last 8760 hours. No results for input(s): WBC, NEUTROABS, HGB, HCT, MCV, PLT in the last 8760 hours. Lab Results  Component Value Date   TSH 1.107 08/17/2011   Lab Results  Component Value Date   HGBA1C  04/03/2010    5.6 (NOTE)                                                                       According  to the ADA Clinical Practice Recommendations for 2011, when HbA1c is used as a screening test:   >=6.5%   Diagnostic of Diabetes Mellitus           (if abnormal result  is confirmed)  5.7-6.4%   Increased risk of developing Diabetes Mellitus  References:Diagnosis and Classification of Diabetes Mellitus,Diabetes [id number] 1):S62-S69 and Standards of Medical Care in         Diabetes - 2011,Diabetes [id number]  (Suppl 1):S11-S61.   Lab Results  Component Value Date   CHOL 158 08/17/2011   HDL 45 08/17/2011   LDLCALC 81 08/17/2011   TRIG 161 (H) 08/17/2011   CHOLHDL 3.5 08/17/2011    Significant Diagnostic Results in last 30 days:  No results found.  Assessment/Plan Non-productive cough Temp 97.3 no improvement of cough on Augmentin prescribed by another provider at [facility].COVID-19 negative.Recommended getting Chest X-ray but declined would like Z-pak since it's worked for her in the past. - Encouraged to increase Fluid intake daily - Take over the counter plain Mucinex for cough  - will send Azithromycin to pharmacy to take tapered dose.Patient familiar with tapering dose. - Advised to take over the counter Probiotics one by mouth twoice daily x 10 days.  - azithromycin (ZITHROMAX) 250 MG tablet; Take 500 mg tablet x 1 dose then 250 mg tablet one by mouth daily x 4 days.  Dispense: 6 tablet; Refill: 0  - Advised to call provider's office if symptoms worsen or not resolved.Agrees to get CXR if symptoms not resolved.   Family/ staff Communication: Reviewed plan of care with patient verbalized understanding.  Labs/tests ordered: None   Next Appointment: as needed if symptoms worsen or fail to resolve.  Spent 22 minutes of  face to face video with patient   I connected with  MAHATHI WIECHMAN on 07/29/19 by a video enabled telemedicine application and verified that I am speaking with the correct person using two identifiers.   I discussed the limitations of  evaluation and management by telemedicine. The patient expressed understanding and agreed to proceed.  Sandrea Hughs, NP

## 2019-07-29 NOTE — Patient Instructions (Signed)
-  Increase Fluid intake daily - Take over the counter plain Mucinex for cough  - Azithromycin (ZITHROMAX) 250 MG tablet; Take 500 mg tablet x 1 dose then 250 mg tablet one by mouth daily x 4 days.  Dispense: 6 tablet; Refill: 0  - Take over the counter Probiotics one by mouth twice daily x 10 days to prevent antibiotics associated diarrhea. -  call provider's office if symptoms worsen or not resolved.

## 2019-08-05 ENCOUNTER — Encounter: Payer: Self-pay | Admitting: Family

## 2019-08-05 ENCOUNTER — Telehealth (INDEPENDENT_AMBULATORY_CARE_PROVIDER_SITE_OTHER): Payer: Medicare Other | Admitting: Family

## 2019-08-05 ENCOUNTER — Other Ambulatory Visit: Payer: Self-pay

## 2019-08-05 DIAGNOSIS — R05 Cough: Secondary | ICD-10-CM

## 2019-08-05 DIAGNOSIS — R058 Other specified cough: Secondary | ICD-10-CM

## 2019-08-05 NOTE — Patient Instructions (Addendum)
-   Please get chest X-ray done at [facility].will call you with results.  -Please go to ED for further evaluation if symptoms worsen.

## 2019-08-05 NOTE — Progress Notes (Signed)
Patient ID: Wendy Houston, female   DOB: 04/12/1951, 69 y.o.   MRN: [id number] This service is provided via telemedicine  No vital signs collected/recorded due to the encounter was a telemedicine visit.   Location of patient (ex: home, work):  HOME  Patient consents to a telephone visit:  YES  Location of the provider (ex: office, home):  OFFICE  Name of any referring provider:  Sherrie Mustache, NP  Names of all persons participating in the telemedicine service and their role in the encounter:  PATIENT, Edwin Dada, [place], [facility], NP  Time spent on call: 4:06   Provider: Jagdeep Ancheta FNP-C  Lauree Chandler, NP  Patient Care Team: Lauree Chandler, NP as PCP - General (Geriatric Medicine)  Extended Emergency Contact Information Primary Emergency Contact: [facility] Address: [address]          [place], [place] Phone: [phone] Work Phone: [phone] Relation: Other Secondary Emergency Contact: [place] [facility] [place] Phone: [phone] Relation: Relative  Code Status: Full Code  Goals of care: Advanced Directive information Advanced Directives 07/29/2019  Does Patient Have a Medical Advance Directive? Yes  Type of Advance Directive Living will;Healthcare Power of [place];Out of facility DNR (pink MOST or yellow form)  Does patient want to make changes to medical advance directive? No - Patient declined  Copy of [place] in Chart? No - copy requested  Would patient like information on creating a medical advance directive? -     Chief Complaint  Patient presents with  . Acute Visit    COUGH    HPI:  Pt is a 69 y.o. female seen today for an acute visit for evaluation of cough.she states cough not getting any better since 07/22/2019.she had COVID-19 test which was negative.she was prescribed Augmentin 10 days at [facility] in [place] took it for a few days then stopped stated it was  not working for her.I had a video visit with her 07/29/2019 request Z-pak which she stated works for her better.she declined a chest X-ray during visit but stated would call back if no improvement. she states was doing better then started having chills and feels like she is " burning up".Also states hurts to breath.She denies any fever,muscle aches or shortness of breath.     Past Medical History:  Diagnosis Date  . Anemia   . Anxiety   . Arthritis   . Barrett esophagus   . Collagenous colitis   . Depression   . Diverticulosis   . Dumping syndrome   . Gastritis, acute   . 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 Hyperlipidemia   . Hypertension   . Leukocytosis    felt to be secondary to collagenous colitis; sees Dr Leontine Locket.  . Lymph edema    around liver  . Other abnormal clinical finding 2009   rapid gastric emptying  . Pancreatic insufficiency 02/20/08   as per stool elastase results   Past Surgical History:  Procedure Laterality Date  . TONSILLECTOMY    . TUBAL LIGATION      Allergies  Allergen Reactions  . Atorvastatin Other (See Comments)    achey  . Atropine Other (See Comments)    Causes huge pupils for days  . Codeine Other (See Comments)    Skin crawls, makes crazy  . Ketorolac Tromethamine Other (See Comments)    Skin crawls, makes crazy  . Meperidine Hcl  All opiates  . Simvastatin Nausea Only    Achey  . Valtrex [Valacyclovir Hcl] Other (See Comments)    "Kidneys shut down"    Outpatient Encounter Medications as of 08/05/2019  Medication Sig  . Acetaminophen (TYLENOL ARTHRITIS PAIN PO) Take 1 tablet by mouth as needed.   . ALPRAZolam (XANAX) 1 MG tablet Take 1 tablet by mouth 2 (two) times a day.  Marland Kitchen amoxicillin-clavulanate (AUGMENTIN) 875-125 MG tablet Take 1 tablet by mouth 2 (two) times daily.  Marland Kitchen azithromycin (ZITHROMAX) 250 MG tablet Take 500 mg tablet x 1 dose then 250 mg tablet one by mouth daily x 4 days.  . busPIRone (BUSPAR)  10 MG tablet Take 10 mg by mouth 2 (two) times daily.  . Cholecalciferol (VITAMIN D3) 50 MCG (2000 UT) TABS Take 1 tablet by mouth daily.  . ondansetron (ZOFRAN-ODT) 4 MG disintegrating tablet Take 2 tablets (8 mg total) by mouth 2 (two) times a day.  . Pancrelipase, Lip-Prot-Amyl, 24000-76000 units CPEP Take 1 capsule by mouth in the morning, at noon, and at bedtime.  . predniSONE (DELTASONE) 20 MG tablet Take 40 mg by mouth daily.  . rosuvastatin (CRESTOR) 10 MG tablet TAKE 1 TABLET BY MOUTH EVERY DAY FOR CHOLESTEROL  . Saccharomyces boulardii (PROBIOTIC) 250 MG CAPS Take 250 capsules by mouth in the morning and at bedtime for 10 days.  . tamsulosin (FLOMAX) 0.4 MG CAPS capsule Take 1 capsule by mouth as needed.   . zinc gluconate 50 MG tablet Take 50 mg by mouth daily.   No facility-administered encounter medications on file as of 08/05/2019.    Review of Systems  Constitutional: Positive for chills. Negative for appetite change, fatigue and fever.  HENT: Negative for congestion, postnasal drip, rhinorrhea, sinus pressure, sinus pain, sneezing and sore throat.   Eyes: Negative for discharge, redness and itching.  Respiratory: Positive for cough. Negative for chest tightness, shortness of breath and wheezing.   Cardiovascular: Negative for chest pain, palpitations and leg swelling.  Gastrointestinal: Negative for abdominal distention, abdominal pain, constipation, diarrhea, nausea and vomiting.  Musculoskeletal: Negative for gait problem and myalgias.  Skin: Negative for color change, pallor and rash.  Neurological: Negative for dizziness, speech difficulty, weakness, light-headedness, numbness and headaches.  Psychiatric/Behavioral: Negative for agitation and confusion. The patient is not nervous/anxious.     Immunization History  Administered Date(s) Administered  . Tdap 09/19/2011   Pertinent  Health Maintenance Due  Topic Date Due  . MAMMOGRAM  04/06/2013  . DEXA SCAN  Never  done  . PNA vac Low Risk Adult (1 of 2 - PCV13) Never done  . INFLUENZA VACCINE  12/22/2018  . COLONOSCOPY  11/22/2020   Fall Risk  07/29/2019 07/22/2019 11/16/2018  Falls in the past year? 0 0 1  Number falls in past yr: 0 0 1  Injury with Fall? 0 0 0    There were no vitals filed for this visit. There is no height or weight on file to calculate BMI. Physical Exam Constitutional:      General: She is not in acute distress.    Appearance: She is not ill-appearing.  Pulmonary:     Effort: Pulmonary effort is normal. No respiratory distress.  Musculoskeletal:        General: Normal range of motion.     Right lower leg: No edema.     Left lower leg: No edema.  Neurological:     Mental Status: She is alert and oriented to person, place,  and time.     Motor: No weakness.     Gait: Gait normal.  Psychiatric:        Mood and Affect: Mood normal.        Behavior: Behavior normal.        Thought Content: Thought content normal.        Judgment: Judgment normal.     Labs reviewed: No results for input(s): NA, K, CL, CO2, GLUCOSE, BUN, CREATININE, CALCIUM, MG, PHOS in the last 8760 hours. No results for input(s): AST, ALT, ALKPHOS, BILITOT, PROT, ALBUMIN in the last 8760 hours. No results for input(s): WBC, NEUTROABS, HGB, HCT, MCV, PLT in the last 8760 hours. Lab Results  Component Value Date   TSH 1.107 08/17/2011   Lab Results  Component Value Date   HGBA1C  04/03/2010    5.6 (NOTE)                                                                       According to the ADA Clinical Practice Recommendations for 2011, when HbA1c is used as a screening test:   >=6.5%   Diagnostic of Diabetes Mellitus           (if abnormal result  is confirmed)  5.7-6.4%   Increased risk of developing Diabetes Mellitus  References:Diagnosis and Classification of Diabetes Mellitus,Diabetes [id number] 1):S62-S69 and Standards of Medical Care in         Diabetes - 2011,Diabetes [id number]   (Suppl 1):S11-S61.   Lab Results  Component Value Date   CHOL 158 08/17/2011   HDL 45 08/17/2011   LDLCALC 81 08/17/2011   TRIG 161 (H) 08/17/2011   CHOLHDL 3.5 08/17/2011    Significant Diagnostic Results in last 30 days:  No results found.  Assessment/Plan  Non-productive cough Reports chills,burning up and hurts to breath.Advised to go to ED for further evaluation but declined gave patient the phone and went to lie down in the bed told provider to given husband the direction for [facility] to get an X-ray. States will get it done in the morning 08/06/2019. - I've advised patient's Husband to take to ED if symptoms worsen verbalized understanding.  - DG Chest 2 View; Future  Family/ staff Communication: Reviewed plan of care with patient and Husband verbalized understanding.   Labs/tests ordered: - DG Chest 2 View; Future  Next Appointment: as needed if symptoms worsen or fail to improve.  Spent 15 minutes of face to face on video with patient and Husband.    Sandrea Hughs, NP

## 2019-09-16 ENCOUNTER — Other Ambulatory Visit: Payer: Self-pay | Admitting: *Deleted

## 2019-09-16 MED ORDER — ROSUVASTATIN CALCIUM 10 MG PO TABS: ORAL_TABLET | ORAL | 0 refills | Status: DC

## 2019-09-16 NOTE — Telephone Encounter (Signed)
rx approved however pt needs to schedule and in office visit for routine follow up

## 2019-09-16 NOTE — Telephone Encounter (Signed)
Patient notified and agreed. Stated that she will call us back and schedule an appointment.

## 2019-09-16 NOTE — Telephone Encounter (Signed)
Patient requested refill Pended and sent to [facility] for approval due to [place].

## 2019-10-26 DIAGNOSIS — J209 Acute bronchitis, unspecified: Secondary | ICD-10-CM | POA: Diagnosis not present

## 2019-10-26 DIAGNOSIS — F17210 Nicotine dependence, cigarettes, uncomplicated: Secondary | ICD-10-CM | POA: Diagnosis not present

## 2019-11-08 ENCOUNTER — Other Ambulatory Visit: Payer: Self-pay | Admitting: *Deleted

## 2019-11-08 MED ORDER — ROSUVASTATIN CALCIUM 10 MG PO TABS: ORAL_TABLET | ORAL | 1 refills | Status: DC

## 2019-11-08 NOTE — Telephone Encounter (Signed)
Received refill Request Pended Rx and sent to [facility] for approval due to [place].

## 2020-01-09 ENCOUNTER — Other Ambulatory Visit: Payer: Self-pay

## 2020-01-09 ENCOUNTER — Encounter: Payer: Self-pay | Admitting: Nurse Practitioner

## 2020-01-09 ENCOUNTER — Telehealth: Payer: Self-pay

## 2020-01-09 ENCOUNTER — Ambulatory Visit (INDEPENDENT_AMBULATORY_CARE_PROVIDER_SITE_OTHER): Payer: Medicare Other | Admitting: Nurse Practitioner

## 2020-01-09 ENCOUNTER — Other Ambulatory Visit: Payer: Self-pay | Admitting: Nurse Practitioner

## 2020-01-09 DIAGNOSIS — Z0000 Encounter for general adult medical examination without abnormal findings: Secondary | ICD-10-CM

## 2020-01-09 DIAGNOSIS — Z1231 Encounter for screening mammogram for malignant neoplasm of breast: Secondary | ICD-10-CM | POA: Diagnosis not present

## 2020-01-09 DIAGNOSIS — E2839 Other primary ovarian failure: Secondary | ICD-10-CM

## 2020-01-09 DIAGNOSIS — E782 Mixed hyperlipidemia: Secondary | ICD-10-CM

## 2020-01-09 DIAGNOSIS — F411 Generalized anxiety disorder: Secondary | ICD-10-CM

## 2020-01-09 DIAGNOSIS — I10 Essential (primary) hypertension: Secondary | ICD-10-CM

## 2020-01-09 DIAGNOSIS — R7303 Prediabetes: Secondary | ICD-10-CM

## 2020-01-09 MED ORDER — ROSUVASTATIN CALCIUM 10 MG PO TABS
ORAL_TABLET | ORAL | 0 refills | Status: DC
Start: 2020-01-09 — End: 2020-02-06

## 2020-01-09 MED ORDER — VITAMIN D3 50 MCG (2000 UT) PO TABS: 1.0000 | ORAL_TABLET | Freq: Every day | ORAL | 5 refills | Status: DC

## 2020-01-09 NOTE — Telephone Encounter (Addendum)
Ms. hibah, odonnell are scheduled for a virtual visit with your provider today.    Just as we do with appointments in the office, we must obtain your consent to participate.  Your consent will be active for this visit and any virtual visit you may have with one of our providers in the next 365 days.    If you have a MyChart account, I can also send a copy of this consent to you electronically.  All virtual visits are billed to your insurance company just like a traditional visit in the office.  As this is a virtual visit, video technology does not allow for your provider to perform a traditional examination.  This may limit your provider's ability to fully assess your condition.  If your provider identifies any concerns that need to be evaluated in person or the need to arrange testing such as labs, EKG, etc, we will make arrangements to do so.    Although advances in technology are sophisticated, we cannot ensure that it will always work on either your end or our end.  If the connection with a video visit is poor, we may have to switch to a telephone visit.  With either a video or telephone visit, we are not always able to ensure that we have a secure connection.   I need to obtain your verbal consent now.   Are you willing to proceed with your visit today?   VONCEIL UPSHUR has provided verbal consent on 01/09/2020 for a virtual visit (video or telephone).   Carroll Kinds, [facility] 01/09/2020  2:36 PM

## 2020-01-09 NOTE — Progress Notes (Signed)
This service is provided via telemedicine  No vital signs collected/recorded due to the encounter was a telemedicine visit.   Location of patient (ex: home, work):  Home  Patient consents to a telephone visit:  Yes, see encounter dated 01/09/2020  Location of the provider (ex: office, home):  [place]  Name of any referring provider:  N/A  Names of all persons participating in the telemedicine service and their role in the encounter:  Sherrie Mustache, Nurse Practitioner, Carroll Kinds, CMA, and patient.   Time spent on call:  8 minutes with medical assistant

## 2020-01-09 NOTE — Patient Instructions (Signed)
Wendy Houston , Thank you for taking time to come for your Medicare Wellness Visit. I appreciate your ongoing commitment to your health goals. Please review the following plan we discussed and let me know if I can assist you in the future.   Screening recommendations/referrals: Colonoscopy up to date Mammogram order placed, recommended to get mammogram- can call [facility] to schedule  Bone Density orders placed today, recommended to get bone density- can call [facility] to schedule  Recommended yearly ophthalmology/optometry visit for glaucoma screening and checkup Recommended yearly dental visit for hygiene and checkup  Vaccinations: Influenza vaccine -you have declined Pneumococcal vaccine- you have declined Tdap vaccine - up to date Shingles vaccine you have declined    Advanced directives: please bring updated copy to office so we can place on file.   Conditions/risks identified: ongoing diarrhea, weight loss, malnutrition.   Next appointment: 1 year for AWV    Preventive Care 75 Years and Older, Female Preventive care refers to lifestyle choices and visits with your health care provider that can promote health and wellness. What does preventive care include?  A yearly physical exam. This is also called an annual well check.  Dental exams once or twice a year.  Routine eye exams. Ask your health care provider how often you should have your eyes checked.  Personal lifestyle choices, including:  Daily care of your teeth and gums.  Regular physical activity.  Eating a healthy diet.  Avoiding tobacco and drug use.  Limiting alcohol use.  Practicing safe sex.  Taking low-dose aspirin every day.  Taking vitamin and mineral supplements as recommended by your health care provider. What happens during an annual well check? The services and screenings done by your health care provider during your annual well check will depend on your age, overall health,  lifestyle risk factors, and family history of disease. Counseling  Your health care provider may ask you questions about your:  Alcohol use.  Tobacco use.  Drug use.  Emotional well-being.  Home and relationship well-being.  Sexual activity.  Eating habits.  History of falls.  Memory and ability to understand (cognition).  Work and work Statistician.  Reproductive health. Screening  You may have the following tests or measurements:  Height, weight, and BMI.  Blood pressure.  Lipid and cholesterol levels. These may be checked every 5 years, or more frequently if you are over 64 years old.  Skin check.  Lung cancer screening. You may have this screening every year starting at age 70 if you have a 30-pack-year history of smoking and currently smoke or have quit within the past 15 years.  Fecal occult blood test (FOBT) of the stool. You may have this test every year starting at age 32.  Flexible sigmoidoscopy or colonoscopy. You may have a sigmoidoscopy every 5 years or a colonoscopy every 10 years starting at age 3.  Hepatitis C blood test.  Hepatitis B blood test.  Sexually transmitted disease (STD) testing.  Diabetes screening. This is done by checking your blood sugar (glucose) after you have not eaten for a while (fasting). You may have this done every 1-3 years.  Bone density scan. This is done to screen for osteoporosis. You may have this done starting at age 90.  Mammogram. This may be done every 1-2 years. Talk to your health care provider about how often you should have regular mammograms. Talk with your health care provider about your test results, treatment options, and if necessary, the need  for more tests. Vaccines  Your health care provider may recommend certain vaccines, such as:  Influenza vaccine. This is recommended every year.  Tetanus, diphtheria, and acellular pertussis (Tdap, Td) vaccine. You may need a Td booster every 10 years.  Zoster  vaccine. You may need this after age 73.  Pneumococcal 13-valent conjugate (PCV13) vaccine. One dose is recommended after age 37.  Pneumococcal polysaccharide (PPSV23) vaccine. One dose is recommended after age 52. Talk to your health care provider about which screenings and vaccines you need and how often you need them. This information is not intended to replace advice given to you by your health care provider. Make sure you discuss any questions you have with your health care provider. Document Released: 06/05/2015 Document Revised: 01/27/2016 Document Reviewed: 03/10/2015 Elsevier Interactive Patient Education  2017 [place] Prevention in the Home Falls can cause injuries. They can happen to people of all ages. There are many things you can do to make your home safe and to help prevent falls. What can I do on the outside of my home?  Regularly fix the edges of walkways and driveways and fix any cracks.  Remove anything that might make you trip as you walk through a door, such as a raised step or threshold.  Trim any bushes or trees on the path to your home.  Use bright outdoor lighting.  Clear any walking paths of anything that might make someone trip, such as rocks or tools.  Regularly check to see if handrails are loose or broken. Make sure that both sides of any steps have handrails.  Any raised decks and porches should have guardrails on the edges.  Have any leaves, snow, or ice cleared regularly.  Use sand or salt on walking paths during winter.  Clean up any spills in your garage right away. This includes oil or grease spills. What can I do in the bathroom?  Use night lights.  Install grab bars by the toilet and in the tub and shower. Do not use towel bars as grab bars.  Use non-skid mats or decals in the tub or shower.  If you need to sit down in the shower, use a plastic, non-slip stool.  Keep the floor dry. Clean up any water that spills on the  floor as soon as it happens.  Remove soap buildup in the tub or shower regularly.  Attach bath mats securely with double-sided non-slip rug tape.  Do not have throw rugs and other things on the floor that can make you trip. What can I do in the bedroom?  Use night lights.  Make sure that you have a light by your bed that is easy to reach.  Do not use any sheets or blankets that are too big for your bed. They should not hang down onto the floor.  Have a firm chair that has side arms. You can use this for support while you get dressed.  Do not have throw rugs and other things on the floor that can make you trip. What can I do in the kitchen?  Clean up any spills right away.  Avoid walking on wet floors.  Keep items that you use a lot in easy-to-reach places.  If you need to reach something above you, use a strong step stool that has a grab bar.  Keep electrical cords out of the way.  Do not use floor polish or wax that makes floors slippery. If you must use wax, use  non-skid floor wax.  Do not have throw rugs and other things on the floor that can make you trip. What can I do with my stairs?  Do not leave any items on the stairs.  Make sure that there are handrails on both sides of the stairs and use them. Fix handrails that are broken or loose. Make sure that handrails are as long as the stairways.  Check any carpeting to make sure that it is firmly attached to the stairs. Fix any carpet that is loose or worn.  Avoid having throw rugs at the top or bottom of the stairs. If you do have throw rugs, attach them to the floor with carpet tape.  Make sure that you have a light switch at the top of the stairs and the bottom of the stairs. If you do not have them, ask someone to add them for you. What else can I do to help prevent falls?  Wear shoes that:  Do not have high heels.  Have rubber bottoms.  Are comfortable and fit you well.  Are closed at the toe. Do not wear  sandals.  If you use a stepladder:  Make sure that it is fully opened. Do not climb a closed stepladder.  Make sure that both sides of the stepladder are locked into place.  Ask someone to hold it for you, if possible.  Clearly mark and make sure that you can see:  Any grab bars or handrails.  First and last steps.  Where the edge of each step is.  Use tools that help you move around (mobility aids) if they are needed. These include:  Canes.  Walkers.  Scooters.  Crutches.  Turn on the lights when you go into a dark area. Replace any light bulbs as soon as they burn out.  Set up your furniture so you have a clear path. Avoid moving your furniture around.  If any of your floors are uneven, fix them.  If there are any pets around you, be aware of where they are.  Review your medicines with your doctor. Some medicines can make you feel dizzy. This can increase your chance of falling. Ask your doctor what other things that you can do to help prevent falls. This information is not intended to replace advice given to you by your health care provider. Make sure you discuss any questions you have with your health care provider. Document Released: 03/05/2009 Document Revised: 10/15/2015 Document Reviewed: 06/13/2014 Elsevier Interactive Patient Education  2017 [employer].

## 2020-01-09 NOTE — Progress Notes (Signed)
Subjective:   Wendy Houston is a 69 y.o. female who presents for Medicare Annual (Subsequent) preventive examination.  Review of Systems     Cardiac Risk Factors include: hypertension;dyslipidemia;sedentary lifestyle     Objective:    Today's Vitals   01/09/20 1440  PainSc: 6    There is no height or weight on file to calculate BMI.  Advanced Directives 01/09/2020 07/29/2019 11/16/2018 11/14/2018 03/21/2016 01/02/2015 01/01/2015  Does Patient Have a Medical Advance Directive? Yes Yes No No Yes Yes No  Type of Paramedic of [place];Living will;Out of facility DNR (pink MOST or yellow form) Living will;Healthcare Power of [place];Out of facility DNR (pink MOST or yellow form) - - Living will Healthcare Power of [place];Living will -  Does patient want to make changes to medical advance directive? No - Patient declined No - Patient declined - - - No - Patient declined -  Copy of [place] in Chart? No - copy requested No - copy requested - - No - copy requested No - copy requested -  Would patient like information on creating a medical advance directive? - - No - Patient declined No - Patient declined - No - patient declined information No - patient declined information    Current Medications (verified) Outpatient Encounter Medications as of 01/09/2020  Medication Sig   Acetaminophen (TYLENOL ARTHRITIS PAIN PO) Take 1 tablet by mouth as needed.    ALPRAZolam (XANAX) 1 MG tablet Take 1 tablet by mouth 2 (two) times a day.   Cholecalciferol (VITAMIN D3) 50 MCG (2000 UT) TABS Take 1 tablet by mouth daily.   LATISSE 0.03 % ophthalmic solution APPLY 1 DROP VIA APPLICATOR ONCE DAILY AS DIRECTED   ondansetron (ZOFRAN-ODT) 4 MG disintegrating tablet Take 2 tablets (8 mg total) by mouth 2 (two) times a day.   rosuvastatin (CRESTOR) 10 MG tablet Take one tablet by mouth once daily for cholesterol.   sertraline (ZOLOFT) 100 MG tablet Take 100 mg by  mouth every morning.    tamsulosin (FLOMAX) 0.4 MG CAPS capsule Take 1 capsule by mouth as needed.    zinc gluconate 50 MG tablet Take 50 mg by mouth daily.   [DISCONTINUED] budesonide (ENTOCORT EC) 3 MG 24 hr capsule Take by mouth.   [DISCONTINUED] Cholecalciferol (VITAMIN D3) 50 MCG (2000 UT) TABS Take 1 tablet by mouth daily.   [DISCONTINUED] rosuvastatin (CRESTOR) 10 MG tablet Take one tablet by mouth once daily for cholesterol.   [DISCONTINUED] amoxicillin-clavulanate (AUGMENTIN) 875-125 MG tablet Take 1 tablet by mouth 2 (two) times daily.   [DISCONTINUED] azithromycin (ZITHROMAX) 250 MG tablet Take 500 mg tablet x 1 dose then 250 mg tablet one by mouth daily x 4 days.   [DISCONTINUED] busPIRone (BUSPAR) 10 MG tablet Take 10 mg by mouth 2 (two) times daily.   [DISCONTINUED] famotidine (PEPCID) 20 MG tablet Take 20 mg by mouth 2 (two) times daily.   [DISCONTINUED] Pancrelipase, Lip-Prot-Amyl, 24000-76000 units CPEP Take 1 capsule by mouth in the morning, at noon, and at bedtime.   [DISCONTINUED] predniSONE (DELTASONE) 20 MG tablet Take 40 mg by mouth daily.   No facility-administered encounter medications on file as of 01/09/2020.    Allergies (verified) Creon [pancrelipase (lip-prot-amyl)], Atorvastatin, Atropine, Codeine, Ketorolac tromethamine, Meperidine hcl, Simvastatin, and Valtrex [valacyclovir hcl]   History: Past Medical History:  Diagnosis Date   Anemia    Anxiety    Arthritis    Barrett esophagus    Collagenous colitis  Depression    Diverticulosis    Dumping syndrome    Gastritis, acute    Heart disease    THICKENING OF BOTTOM PART OF HEART   Hyperlipidemia    Hypertension    Leukocytosis    felt to be secondary to collagenous colitis; sees Dr Leontine Locket.   Lymph edema    around liver   Other abnormal clinical finding 2009   rapid gastric emptying   Pancreatic insufficiency 02/20/08   as per stool elastase results   Past Surgical  History:  Procedure Laterality Date   TONSILLECTOMY     TUBAL LIGATION     Family History  Problem Relation Age of Onset   Breast cancer Mother    Diabetes Mother    Hyperlipidemia Mother    Hypertension Mother    Ulcers Father    Colitis Father    Testicular cancer Son    Melanoma Son        arm   Diabetes Son    Diabetes Maternal Grandmother    Social History   Socioeconomic History   Marital status: Married    Spouse name: Fritz Pickerel    Number of children: 2   Years of education: High schoo   Highest education level: Not on file  Occupational History   Occupation: Loss adjuster, chartered: SELF EMPLOYED  Tobacco Use   Smoking status: Former Smoker    Types: Cigarettes    Quit date: 06/24/2019    Years since quitting: 0.5   Smokeless tobacco: Never Used  Vaping Use   Vaping Use: Never used  Substance and Sexual Activity   Alcohol use: Not Currently   Drug use: No   Sexual activity: Not on file  Other Topics Concern   Not on file  Social History Narrative   Tobacco use, amount per day now: 1 pack per day   Past tobacco use, amount per day: 1 pack a day   How many years did you use tobacco:  40 years   Alcohol use (drinks per week): 1-3 a week   Diet: poor/malnutrition   Do you drink/eat things with caffeine: coffee and ginger ale   Marital status:  married                    What year were you married? 1974   Do you live in a house, apartment, assisted living, condo, trailer, etc.? house   Is it one or more stories? one   How many persons live in your home? 2   Do you have pets in your home?( please list) 3 cats   Current or past profession: n/a   Do you exercise?    walk                              Type and how often? daily   Do you have a living will? no   Do you have a DNR form?  no                                 If not, do you want to discuss one?   Do you have signed POA/HPOA forms?  no                      If so,  please bring to you appointment  Social Determinants of Health   Financial Resource Strain:    Difficulty of Paying Living Expenses: Not on file  Food Insecurity:    Worried About Charity fundraiser in the Last Year: Not on file   [employer] of Food in the Last Year: Not on file  Transportation Needs:    Lack of Transportation (Medical): Not on file   Lack of Transportation (Non-Medical): Not on file  Physical Activity:    Days of Exercise per Week: Not on file   Minutes of Exercise per Session: Not on file  Stress:    Feeling of Stress : Not on file  Social Connections:    Frequency of Communication with Friends and Family: Not on file   Frequency of Social Gatherings with Friends and Family: Not on file   Attends Religious Services: Not on file   Active Member of Clubs or Organizations: Not on file   Attends Archivist Meetings: Not on file   Marital Status: Not on file    Tobacco Counseling Counseling given: Not Answered   Clinical Intake:  Pre-visit preparation completed: Yes  Pain : 0-10 Pain Score: 6  Pain Type: Chronic pain Pain Location: Other (Comment) (stomach) Pain Orientation: Left Pain Radiating Towards: back Pain Descriptors / Indicators: Stabbing Pain Onset: More than a month ago (years) Pain Frequency: Intermittent Pain Relieving Factors: none  Pain Relieving Factors: none  BMI - recorded: 19 Nutritional Status: BMI <19  Underweight Nutritional Risks: Nausea/ vomitting/ diarrhea, Unintentional weight loss, Failure to thrive Diabetes: No  How often do you need to have someone help you when you read instructions, pamphlets, or other written materials from your doctor or pharmacy?: 1 - Never  Diabetic?no         Activities of Daily Living In your present state of health, do you have any difficulty performing the following activities: 01/09/2020  Hearing? N  Vision? N  Difficulty concentrating or making decisions? Y    Comment depending on the day  Walking or climbing stairs? N  Dressing or bathing? N  Doing errands, shopping? N  Preparing Food and eating ? N  Using the Toilet? N  In the past six months, have you accidently leaked urine? N  Do you have problems with loss of bowel control? Y  Managing your Medications? N  Managing your Finances? N  Housekeeping or managing your Housekeeping? N  Some recent data might be hidden    Patient Care Team: Lauree Chandler, NP as PCP - General (Geriatric Medicine) Curt Jews, OD (Optometry)  Indicate any recent Medical Services you may have received from other than [facility] providers in the past year (date may be approximate).     Assessment:   This is a routine wellness examination for Laylynn.  Hearing/Vision screen  Hearing Screening   125Hz  250Hz  500Hz  1000Hz  2000Hz  3000Hz  4000Hz  6000Hz  8000Hz   Right ear:           Left ear:           Comments: Patient has no problems with hearing  Vision Screening Comments: Patient wears glasses had eye exam befroe Christmas  Dietary issues and exercise activities discussed: Current Exercise Habits: Home exercise routine, Time (Minutes): 15, Frequency (Times/Week): 3, Weekly Exercise (Minutes/Week): 45  Goals   None    Depression Screen PHQ 2/9 Scores 01/09/2020 11/16/2018  PHQ - 2 Score - 0  Exception Documentation Other- indicate reason in comment box -  Not completed Patient see  psychiatristfor depression -    Fall Risk Fall Risk  01/09/2020 07/29/2019 07/22/2019 11/16/2018  Falls in the past year? 0 0 0 1  Number falls in past yr: 0 0 0 1  Injury with Fall? 0 0 0 0    Any stairs in or around the home? No  If so, are there any without handrails? No  Home free of loose throw rugs in walkways, pet beds, electrical cords, etc? Yes  Adequate lighting in your home to reduce risk of falls? Yes   ASSISTIVE DEVICES UTILIZED TO PREVENT FALLS:  Life alert? No  Use of a cane, walker or w/c? No  Grab  bars in the bathroom? No  Shower chair or bench in shower? No  Elevated toilet seat or a handicapped toilet? Yes   TIMED UP AND GO:  Cognitive Function:     6CIT Screen 01/09/2020  What Year? 0 points  What month? 0 points  What time? 0 points  Count back from 20 0 points  Months in reverse 0 points  Repeat phrase 0 points  Total Score 0    Immunizations Immunization History  Administered Date(s) Administered   Tdap 09/19/2011    TDAP status: Up to date Flu Vaccine status: Declined, Education has been provided regarding the importance of this vaccine but patient still declined. Advised may receive this vaccine at local pharmacy or Health Dept. Aware to provide a copy of the vaccination record if obtained from local pharmacy or Health Dept. Verbalized acceptance and understanding. Pneumococcal vaccine status: Declined,  Education has been provided regarding the importance of this vaccine but patient still declined. Advised may receive this vaccine at local pharmacy or Health Dept. Aware to provide a copy of the vaccination record if obtained from local pharmacy or Health Dept. Verbalized acceptance and understanding.  Covid-19 vaccine status: Declined, Education has been provided regarding the importance of this vaccine but patient still declined. Advised may receive this vaccine at local pharmacy or Health Dept.or vaccine clinic. Aware to provide a copy of the vaccination record if obtained from local pharmacy or Health Dept. Verbalized acceptance and understanding.  Qualifies for Shingles Vaccine? No   Zostavax completed No   Shingrix Completed?: No.    Education has been provided regarding the importance of this vaccine. Patient has been advised to call insurance company to determine out of pocket expense if they have not yet received this vaccine. Advised may also receive vaccine at local pharmacy or Health Dept. Verbalized acceptance and understanding.  Screening Tests Health  Maintenance  Topic Date Due   Hepatitis C Screening  Never done   COVID-19 Vaccine (1) Never done   MAMMOGRAM  04/06/2013   DEXA SCAN  Never done   PNA vac Low Risk Adult (1 of 2 - PCV13) Never done   INFLUENZA VACCINE  12/22/2019   COLONOSCOPY  11/22/2020   TETANUS/TDAP  09/18/2021    Health Maintenance  Health Maintenance Due  Topic Date Due   Hepatitis C Screening  Never done   COVID-19 Vaccine (1) Never done   MAMMOGRAM  04/06/2013   DEXA SCAN  Never done   PNA vac Low Risk Adult (1 of 2 - PCV13) Never done   INFLUENZA VACCINE  12/22/2019    Colorectal cancer screening: Completed 2021. Repeat every 3 years Mammogram status: Ordered today. Pt provided with contact info and advised to call to schedule appt.  Bone Density status: Ordered today. Pt provided with contact info and advised to  call to schedule appt.  Lung Cancer Screening: (Low Dose CT Chest recommended if Age 20-80 years, 30 pack-year currently smoking OR have quit w/in 15years.) does not qualify.   Lung Cancer Screening Referral: na  Additional Screening:  Hepatitis C Screening: does not qualify; Completed done  Vision Screening: Recommended annual ophthalmology exams for early detection of glaucoma and other disorders of the eye. Is the patient up to date with their annual eye exam?  Yes  Who is the provider or what is the name of the office in which the patient attends annual eye exams? Dr Jerelyn Scott If pt is not established with a provider, would they like to be referred to a provider to establish care? No .   Dental Screening: Recommended annual dental exams for proper oral hygiene  Community Resource Referral / Chronic Care Management: CRR required this visit?  No   CCM required this visit?  No      Plan:     I have personally reviewed and noted the following in the patients chart:    Medical and social history  Use of alcohol, tobacco or illicit drugs   Current medications and  supplements  Functional ability and status  Nutritional status  Physical activity  Advanced directives  List of other physicians  Hospitalizations, surgeries, and ER visits in previous 12 months  Vitals  Screenings to include cognitive, depression, and falls  Referrals and appointments  In addition, I have reviewed and discussed with patient certain preventive protocols, quality metrics, and best practice recommendations. A written personalized care plan for preventive services as well as general preventive health recommendations were provided to patient.     Lauree Chandler, NP   01/09/2020

## 2020-01-30 ENCOUNTER — Other Ambulatory Visit: Payer: Medicare Other

## 2020-02-05 ENCOUNTER — Ambulatory Visit: Payer: Medicare Other | Admitting: Nurse Practitioner

## 2020-02-06 ENCOUNTER — Other Ambulatory Visit: Payer: Self-pay

## 2020-02-06 ENCOUNTER — Telehealth: Payer: Self-pay | Admitting: Nurse Practitioner

## 2020-02-06 ENCOUNTER — Telehealth (INDEPENDENT_AMBULATORY_CARE_PROVIDER_SITE_OTHER): Payer: Medicare Other | Admitting: Nurse Practitioner

## 2020-02-06 ENCOUNTER — Encounter: Payer: Self-pay | Admitting: Nurse Practitioner

## 2020-02-06 DIAGNOSIS — F411 Generalized anxiety disorder: Secondary | ICD-10-CM | POA: Diagnosis not present

## 2020-02-06 DIAGNOSIS — K22719 Barrett's esophagus with dysplasia, unspecified: Secondary | ICD-10-CM | POA: Diagnosis not present

## 2020-02-06 DIAGNOSIS — R7303 Prediabetes: Secondary | ICD-10-CM | POA: Diagnosis not present

## 2020-02-06 DIAGNOSIS — K8689 Other specified diseases of pancreas: Secondary | ICD-10-CM

## 2020-02-06 DIAGNOSIS — I10 Essential (primary) hypertension: Secondary | ICD-10-CM | POA: Diagnosis not present

## 2020-02-06 DIAGNOSIS — E782 Mixed hyperlipidemia: Secondary | ICD-10-CM | POA: Diagnosis not present

## 2020-02-06 MED ORDER — ROSUVASTATIN CALCIUM 10 MG PO TABS: ORAL_TABLET | ORAL | 0 refills | Status: DC

## 2020-02-06 NOTE — Progress Notes (Signed)
Careteam: Patient Care Team: Lauree Chandler, NP as PCP - General (Geriatric Medicine) Curt Jews, OD (Optometry)  Advanced Directive information    Allergies  Allergen Reactions  . Creon [Pancrelipase (Lip-Prot-Amyl)]   . Atorvastatin Other (See Comments)    achey  . Atropine Other (See Comments)    Causes huge pupils for days  . Codeine Other (See Comments)    Skin crawls, makes crazy  . Ketorolac Tromethamine Other (See Comments)    Skin crawls, makes crazy  . Meperidine Hcl     All opiates  . Simvastatin Nausea Only    Achey  . Valtrex [Valacyclovir Hcl] Other (See Comments)    "Kidneys shut down"    Chief Complaint  Patient presents with  . Acute Visit    Discuss medications.Needs refill on Crestor.     HPI: Patient is a 69 y.o. female for routine follow up  Pt has pancreatic insuffiencey, has found specialist in [place] that she plans to see to help her manage this condition. She is not able to eat. Anything she eats goes right though her due to the lack of enzymes. No appetite. Increase nausea, using zofran, prescribed by GI doctor.   Recently had colonoscopy and endoscopy. Reports crohn's and barrettes esophagus is worse.    Reports she had COVID 2 weeks after christmas and was very ill.   See psych for anxiety and depression. Currently maintained on xanax 1 mg BID and zoloft.   Reports she has "autoimmune disease" with elevation in WBC and GI managing.   Review of Systems:  Review of Systems  Constitutional: Positive for malaise/fatigue and weight loss. Negative for chills and fever.  Respiratory: Negative for cough and shortness of breath.   Cardiovascular: Negative for chest pain and leg swelling.  Gastrointestinal: Positive for abdominal pain, diarrhea, nausea and vomiting. Negative for constipation and heartburn.  Genitourinary: Negative for dysuria, frequency and urgency.  Neurological: Positive for weakness.  Psychiatric/Behavioral:  Positive for depression. The patient is nervous/anxious.     Past Medical History:  Diagnosis Date  . Anemia   . Anxiety   . Arthritis   . Barrett esophagus   . Collagenous colitis   . Depression   . Diverticulosis   . Dumping syndrome   . Gastritis, acute   . 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 Hyperlipidemia   . Hypertension   . Leukocytosis    felt to be secondary to collagenous colitis; sees Dr Leontine Locket.  . Lymph edema    around liver  . Other abnormal clinical finding 2009   rapid gastric emptying  . Pancreatic insufficiency 02/20/08   as per stool elastase results   Past Surgical History:  Procedure Laterality Date  . TONSILLECTOMY    . TUBAL LIGATION     Social History:   reports that she quit smoking about 7 months ago. Her smoking use included cigarettes. She has never used smokeless tobacco. She reports previous alcohol use. She reports that she does not use drugs.  Family History  Problem Relation Age of Onset  . Breast cancer Mother   . Diabetes Mother   . Hyperlipidemia Mother   . Hypertension Mother   . Ulcers Father   . Colitis Father   . Testicular cancer Son   . Melanoma Son        arm  . Diabetes Son   . Diabetes Maternal Grandmother     Medications: Patient's  Medications  New Prescriptions   No medications on file  Previous Medications   ACETAMINOPHEN (TYLENOL ARTHRITIS PAIN PO)    Take 1 tablet by mouth as needed.    ALPRAZOLAM (XANAX) 1 MG TABLET    Take 1 tablet by mouth 2 (two) times a day.   CHOLECALCIFEROL (VITAMIN D3) 50 MCG (2000 UT) TABS    Take 1 tablet by mouth daily.   LATISSE 0.03 % OPHTHALMIC SOLUTION    APPLY 1 DROP VIA APPLICATOR ONCE DAILY AS DIRECTED   ONDANSETRON (ZOFRAN-ODT) 4 MG DISINTEGRATING TABLET    Take 2 tablets (8 mg total) by mouth 2 (two) times a day.   ROSUVASTATIN (CRESTOR) 10 MG TABLET    Take one tablet by mouth once daily for cholesterol.   SERTRALINE (ZOLOFT) 100 MG TABLET    Take 100  mg by mouth every morning.    TAMSULOSIN (FLOMAX) 0.4 MG CAPS CAPSULE    Take 1 capsule by mouth as needed.    ZINC GLUCONATE 50 MG TABLET    Take 50 mg by mouth daily.  Modified Medications   No medications on file  Discontinued Medications   No medications on file    Physical Exam:  There were no vitals filed for this visit. There is no height or weight on file to calculate BMI. Wt Readings from Last 3 Encounters:  11/16/18 111 lb (50.3 kg)  03/21/16 110 lb 6.4 oz (50.1 kg)  01/02/15 112 lb 3.2 oz (50.9 kg)      Labs reviewed: Basic Metabolic Panel: No results for input(s): NA, K, CL, CO2, GLUCOSE, BUN, CREATININE, CALCIUM, MG, PHOS, TSH in the last 8760 hours. Liver Function Tests: No results for input(s): AST, ALT, ALKPHOS, BILITOT, PROT, ALBUMIN in the last 8760 hours. No results for input(s): LIPASE, AMYLASE in the last 8760 hours. No results for input(s): AMMONIA in the last 8760 hours. CBC: No results for input(s): WBC, NEUTROABS, HGB, HCT, MCV, PLT in the last 8760 hours. Lipid Panel: No results for input(s): CHOL, HDL, LDLCALC, TRIG, CHOLHDL, LDLDIRECT in the last 8760 hours. TSH: No results for input(s): TSH in the last 8760 hours. A1C: Lab Results  Component Value Date   HGBA1C  04/03/2010    5.6 (NOTE)                                                                       According to the ADA Clinical Practice Recommendations for 2011, when HbA1c is used as a screening test:   >=6.5%   Diagnostic of Diabetes Mellitus           (if abnormal result  is confirmed)  5.7-6.4%   Increased risk of developing Diabetes Mellitus  References:Diagnosis and Classification of Diabetes Mellitus,Diabetes BHAL,9379,02(IOXBD 1):S62-S69 and Standards of Medical Care in         Diabetes - 2011,Diabetes ZHGD,9242,68  (Suppl 1):S11-S61.     Assessment/Plan 1. Mixed hyperlipidemia Has not come to office for follow up blood work- will give 1 month supply but discussed with pt we  will need to follow up labs to be able to continue to prescribe medication.  -dietary modifications encouraged.  - rosuvastatin (CRESTOR) 10 MG tablet; Take one tablet by mouth once daily  for cholesterol.  Dispense: 30 tablet; Refill: 0  2. Essential hypertension, benign -does not have recent blood pressure reading. Not currently on medication. Low sodium diet recommended.  3. Prediabetes Plan to follow up A1c with blood work.   4. Generalized anxiety disorder Followed by psych, continues on zoloft 100 mg daily   5. Barrett's esophagus with dysplasia Ongoing symptoms, followed by GI with recent endoscopy   6. Pancreatic insufficiency Plans to follow up with pancreatic specialist in [place].   Next appt: due for fasting blood work in the next month to continue to get refills on medication.  Carlos American. Harle Battiest  [facility] [phone]    Virtual Visit via Deloris Ping  I connected with patient on 02/06/20 at  1:30 PM EDT by telephone and verified that I am speaking with the correct person using two identifiers.  Location: Patient: home [facility]: [facility]   I discussed the limitations, risks, security and privacy concerns of performing an evaluation and management service by telephone and the availability of in person appointments. I also discussed with the patient that there may be a patient responsible charge related to this service. The patient expressed understanding and agreed to proceed.   I discussed the assessment and treatment plan with the patient. The patient was provided an opportunity to ask questions and all were answered. The patient agreed with the plan and demonstrated an understanding of the instructions.   The patient was advised to call back or seek an in-person evaluation if the symptoms worsen or if the condition fails to improve as anticipated.  I provided 30 minutes of non-face-to-face time during this  encounter.  Carlos American. Harle Battiest Avs printed and mailed

## 2020-02-06 NOTE — Progress Notes (Signed)
This service is provided via telemedicine  No vital signs collected/recorded due to the encounter was a telemedicine visit.   Location of patient (ex: home, work):  Home  Patient consents to a telephone visit:  Yes, see encounter dated 01/09/2020  Location of the provider (ex: office, home):  [place]  Name of any referring provider:  N/A  Names of all persons participating in the telemedicine service and their role in the encounter:  Sherrie Mustache, Nurse Practitioner, Carroll Kinds, CMA, and patient.   Time spent on call:  10 minutes with medical assistant

## 2020-02-06 NOTE — Telephone Encounter (Signed)
-----   Message from Lauree Chandler, NP sent at 02/06/2020  2:10 PM EDT ----- On the phone with this lady now for appt, she needs to come in office for fasting lab work within the next month.

## 2020-02-06 NOTE — Telephone Encounter (Signed)
Called patient on 02/06/20 Per Sherrie Mustache request to schedule Fasting Labs. Patient refused and said that she would call back after her trip on 27th or 28th . She did not want to schedule at the time of my call to her but she would call us back to schedule.

## 2020-02-17 DIAGNOSIS — K52831 Collagenous colitis: Secondary | ICD-10-CM | POA: Diagnosis not present

## 2020-02-17 DIAGNOSIS — K2270 Barrett's esophagus without dysplasia: Secondary | ICD-10-CM | POA: Diagnosis not present

## 2020-02-17 DIAGNOSIS — R197 Diarrhea, unspecified: Secondary | ICD-10-CM | POA: Diagnosis not present

## 2020-03-26 DIAGNOSIS — R197 Diarrhea, unspecified: Secondary | ICD-10-CM | POA: Diagnosis not present

## 2020-03-26 DIAGNOSIS — R110 Nausea: Secondary | ICD-10-CM | POA: Diagnosis not present

## 2020-03-26 DIAGNOSIS — K52831 Collagenous colitis: Secondary | ICD-10-CM | POA: Diagnosis not present

## 2020-03-26 DIAGNOSIS — R109 Unspecified abdominal pain: Secondary | ICD-10-CM | POA: Diagnosis not present

## 2020-03-28 DIAGNOSIS — E7800 Pure hypercholesterolemia, unspecified: Secondary | ICD-10-CM | POA: Diagnosis not present

## 2020-03-28 DIAGNOSIS — R5383 Other fatigue: Secondary | ICD-10-CM | POA: Diagnosis not present

## 2020-03-28 DIAGNOSIS — R1084 Generalized abdominal pain: Secondary | ICD-10-CM | POA: Diagnosis not present

## 2020-03-28 DIAGNOSIS — Z131 Encounter for screening for diabetes mellitus: Secondary | ICD-10-CM | POA: Diagnosis not present

## 2020-03-28 DIAGNOSIS — E559 Vitamin D deficiency, unspecified: Secondary | ICD-10-CM | POA: Diagnosis not present

## 2020-03-30 DIAGNOSIS — R1084 Generalized abdominal pain: Secondary | ICD-10-CM | POA: Diagnosis not present

## 2020-03-30 DIAGNOSIS — Z7901 Long term (current) use of anticoagulants: Secondary | ICD-10-CM | POA: Diagnosis not present

## 2020-04-03 DIAGNOSIS — Z1231 Encounter for screening mammogram for malignant neoplasm of breast: Secondary | ICD-10-CM | POA: Diagnosis not present

## 2020-04-03 DIAGNOSIS — E559 Vitamin D deficiency, unspecified: Secondary | ICD-10-CM | POA: Diagnosis not present

## 2020-04-03 DIAGNOSIS — Z780 Asymptomatic menopausal state: Secondary | ICD-10-CM | POA: Diagnosis not present

## 2020-04-03 DIAGNOSIS — E7800 Pure hypercholesterolemia, unspecified: Secondary | ICD-10-CM | POA: Diagnosis not present

## 2020-04-06 DIAGNOSIS — R1084 Generalized abdominal pain: Secondary | ICD-10-CM | POA: Diagnosis not present

## 2020-04-10 DIAGNOSIS — F17210 Nicotine dependence, cigarettes, uncomplicated: Secondary | ICD-10-CM | POA: Diagnosis not present

## 2020-04-10 DIAGNOSIS — Z122 Encounter for screening for malignant neoplasm of respiratory organs: Secondary | ICD-10-CM | POA: Diagnosis not present

## 2020-04-10 DIAGNOSIS — R0602 Shortness of breath: Secondary | ICD-10-CM | POA: Diagnosis not present

## 2020-06-11 DIAGNOSIS — R059 Cough, unspecified: Secondary | ICD-10-CM | POA: Diagnosis not present

## 2020-06-15 ENCOUNTER — Ambulatory Visit: Payer: Self-pay | Admitting: Family

## 2020-06-16 DIAGNOSIS — R1013 Epigastric pain: Secondary | ICD-10-CM | POA: Diagnosis not present

## 2020-06-16 DIAGNOSIS — K2270 Barrett's esophagus without dysplasia: Secondary | ICD-10-CM | POA: Diagnosis not present

## 2020-06-16 DIAGNOSIS — K52831 Collagenous colitis: Secondary | ICD-10-CM | POA: Diagnosis not present

## 2020-06-16 DIAGNOSIS — R634 Abnormal weight loss: Secondary | ICD-10-CM | POA: Diagnosis not present

## 2020-07-17 DIAGNOSIS — R634 Abnormal weight loss: Secondary | ICD-10-CM | POA: Diagnosis not present

## 2020-07-17 DIAGNOSIS — R1013 Epigastric pain: Secondary | ICD-10-CM | POA: Diagnosis not present

## 2020-07-27 DIAGNOSIS — R1013 Epigastric pain: Secondary | ICD-10-CM | POA: Diagnosis not present

## 2020-08-04 DIAGNOSIS — J432 Centrilobular emphysema: Secondary | ICD-10-CM | POA: Diagnosis not present

## 2020-08-04 DIAGNOSIS — J439 Emphysema, unspecified: Secondary | ICD-10-CM | POA: Diagnosis not present

## 2020-08-04 DIAGNOSIS — R1013 Epigastric pain: Secondary | ICD-10-CM | POA: Diagnosis not present

## 2020-08-04 DIAGNOSIS — I774 Celiac artery compression syndrome: Secondary | ICD-10-CM | POA: Diagnosis not present

## 2020-08-04 DIAGNOSIS — I2510 Atherosclerotic heart disease of native coronary artery without angina pectoris: Secondary | ICD-10-CM | POA: Diagnosis not present

## 2020-08-04 DIAGNOSIS — K5730 Diverticulosis of large intestine without perforation or abscess without bleeding: Secondary | ICD-10-CM | POA: Diagnosis not present

## 2020-08-04 DIAGNOSIS — I708 Atherosclerosis of other arteries: Secondary | ICD-10-CM | POA: Diagnosis not present

## 2020-08-04 DIAGNOSIS — I700 Atherosclerosis of aorta: Secondary | ICD-10-CM | POA: Diagnosis not present

## 2020-08-28 DIAGNOSIS — G8929 Other chronic pain: Secondary | ICD-10-CM | POA: Diagnosis not present

## 2020-08-28 DIAGNOSIS — R109 Unspecified abdominal pain: Secondary | ICD-10-CM | POA: Diagnosis not present

## 2020-09-01 DIAGNOSIS — R911 Solitary pulmonary nodule: Secondary | ICD-10-CM | POA: Diagnosis not present

## 2020-09-01 DIAGNOSIS — J439 Emphysema, unspecified: Secondary | ICD-10-CM | POA: Diagnosis not present

## 2020-09-01 DIAGNOSIS — J209 Acute bronchitis, unspecified: Secondary | ICD-10-CM | POA: Diagnosis not present

## 2020-09-24 DIAGNOSIS — I774 Celiac artery compression syndrome: Secondary | ICD-10-CM | POA: Diagnosis not present

## 2020-09-24 DIAGNOSIS — R911 Solitary pulmonary nodule: Secondary | ICD-10-CM | POA: Diagnosis not present

## 2020-09-24 DIAGNOSIS — Z885 Allergy status to narcotic agent status: Secondary | ICD-10-CM | POA: Diagnosis not present

## 2020-09-24 DIAGNOSIS — Z888 Allergy status to other drugs, medicaments and biological substances status: Secondary | ICD-10-CM | POA: Diagnosis not present

## 2020-09-24 DIAGNOSIS — Z886 Allergy status to analgesic agent status: Secondary | ICD-10-CM | POA: Diagnosis not present

## 2020-09-24 DIAGNOSIS — G8929 Other chronic pain: Secondary | ICD-10-CM | POA: Diagnosis not present

## 2020-09-24 DIAGNOSIS — R109 Unspecified abdominal pain: Secondary | ICD-10-CM | POA: Diagnosis not present

## 2021-01-14 ENCOUNTER — Telehealth: Payer: Self-pay

## 2021-01-14 ENCOUNTER — Ambulatory Visit (INDEPENDENT_AMBULATORY_CARE_PROVIDER_SITE_OTHER): Payer: Medicare Other | Admitting: Nurse Practitioner

## 2021-01-14 ENCOUNTER — Other Ambulatory Visit: Payer: Self-pay

## 2021-01-14 ENCOUNTER — Encounter: Payer: Self-pay | Admitting: Nurse Practitioner

## 2021-01-14 DIAGNOSIS — K8689 Other specified diseases of pancreas: Secondary | ICD-10-CM | POA: Diagnosis not present

## 2021-01-14 DIAGNOSIS — R109 Unspecified abdominal pain: Secondary | ICD-10-CM

## 2021-01-14 DIAGNOSIS — F411 Generalized anxiety disorder: Secondary | ICD-10-CM | POA: Diagnosis not present

## 2021-01-14 DIAGNOSIS — G8929 Other chronic pain: Secondary | ICD-10-CM

## 2021-01-14 NOTE — Progress Notes (Signed)
   This service is provided via telemedicine  No vital signs collected/recorded due to the encounter was a telemedicine visit.   Location of patient (ex: home, work):  Home  Patient consents to a telephone visit: Yes, see telephone visit dated 01/14/21  Location of the provider (ex: office, home):  [facility], Office   Name of any referring provider:  N/A  Names of all persons participating in the telemedicine service and their role in the encounter:  S.Chrae B/CMA, Sherrie Mustache, NP, and Patient   Time spent on call:  24 min with medical assistant

## 2021-01-14 NOTE — Telephone Encounter (Signed)
[name], haskew are scheduled for a virtual visit with your provider today.    Just as we do with appointments in the office, we must obtain your consent to participate.  Your consent will be active for this visit and any virtual visit you may have with one of our providers in the next 365 days.    If you have a MyChart account, I can also send a copy of this consent to you electronically.  All virtual visits are billed to your insurance company just like a traditional visit in the office.  As this is a virtual visit, video technology does not allow for your provider to perform a traditional examination.  This may limit your provider's ability to fully assess your condition.  If your provider identifies any concerns that need to be evaluated in person or the need to arrange testing such as labs, EKG, etc, we will make arrangements to do so.    Although advances in technology are sophisticated, we cannot ensure that it will always work on either your end or our end.  If the connection with a video visit is poor, we may have to switch to a telephone visit.  With either a video or telephone visit, we are not always able to ensure that we have a secure connection.   I need to obtain your verbal consent now.   Are you willing to proceed with your visit today?   Wendy Houston has provided verbal consent on 01/14/2021 for a virtual visit (video or telephone).   [name] [place], [place] 01/14/2021  10:00 am

## 2021-01-14 NOTE — Progress Notes (Signed)
Careteam: Patient Care Team: Lauree Chandler, NP as PCP - General (Geriatric Medicine) Curt Jews, OD (Optometry) Hale Bogus., MD as Referring Physician (Gastroenterology)  Advanced Directive information    Allergies  Allergen Reactions   Creon [Pancrelipase (Lip-Prot-Amyl)]    Atorvastatin Other (See Comments)    achey   Atropine Other (See Comments)    Causes huge pupils for days   Codeine Other (See Comments)    Skin crawls, makes crazy   Ketorolac Tromethamine Other (See Comments)    Skin crawls, makes crazy   Meperidine Hcl     All opiates   Simvastatin Nausea Only    Achey   Valtrex [Valacyclovir Hcl] Other (See Comments)    "Kidneys shut down"    Chief Complaint  Patient presents with   Acute Visit    Patient is asking for a referral to palliative care, patient states " I am waiting to die." Patient is requesting pain medication for generalized pain, states " I am in the floor screaming to die at times because of the pain." Patient states the constant pain is genetic. Patient is allergic to most opioids. Discuss crestor, patient is no longer taking, explanation is unclear.      HPI: Patient is a 70 y.o. female requesting video visit. Pt has not been seen in office since 2020 and that was to establish care with Dinah, Np. She has subsequently done video visits yearly since due to lack of mobility. She has followed with GI due to collagenous colitis, diarrhea, nausea and abdominal pain. Pt also with hx of anxiety followed by psych.   Reports her son is a PA and has been taking her to specialist who is her POA over her health.   Reports has been sick for 3 years, felt like it was a vascular issue. 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 She was referred for a 2 cm spiculated nodule in the RML on the CT. Has seen pulmonary Lots of pain abnormal pain.   Pancreatitic insufficiency  Has  been evaluated at pain clinic.   Review of Systems:  Review of Systems  Constitutional:  Positive for malaise/fatigue and weight loss. Negative for chills and fever.  HENT:  Negative for hearing loss and tinnitus.   Respiratory:  Negative for cough, sputum production and shortness of breath.   Cardiovascular:  Negative for chest pain, palpitations and leg swelling.  Gastrointestinal:  Positive for abdominal pain. Negative for constipation, diarrhea and heartburn.  Genitourinary:  Negative for dysuria, frequency and urgency.  Musculoskeletal:  Positive for myalgias. Negative for back pain and joint pain.  Skin: Negative.   Neurological:  Negative for dizziness and headaches.  Psychiatric/Behavioral:  Positive for depression. Negative for memory loss. The patient is nervous/anxious. The patient does not have insomnia.   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   Lymph edema    around liver   Other abnormal clinical finding 2009   rapid gastric emptying  Pancreatic insufficiency 02/20/08   as per stool elastase results   Past Surgical History:  Procedure Laterality Date   TONSILLECTOMY     TUBAL LIGATION     Social History:   reports that she quit smoking about 18 months ago. Her smoking use included cigarettes. She has never used smokeless tobacco. She reports that she does not currently use alcohol. She reports that she does not use drugs.  Family History  Problem Relation Age of Onset   Breast cancer Mother    Diabetes Mother    Hyperlipidemia Mother    Hypertension Mother    Ulcers Father    Colitis Father    Testicular cancer Son    Melanoma Son         arm   Diabetes Son    Diabetes Maternal Grandmother     Medications: Patient's Medications  New Prescriptions   No medications on file  Previous Medications   ACETAMINOPHEN (TYLENOL ARTHRITIS PAIN PO)    Take 1 tablet by mouth as needed.    ALPRAZOLAM (XANAX) 1 MG TABLET    Take 1 tablet by mouth 4 (four) times daily.   CHOLECALCIFEROL (VITAMIN D3) 50 MCG (2000 UT) TABS    Take 1 tablet by mouth daily.   DICYCLOMINE (BENTYL) 10 MG CAPSULE    Take 10 mg by mouth 4 (four) times daily -  before meals and at bedtime.   MIRTAZAPINE (REMERON) 7.5 MG TABLET    Take 7.5 mg by mouth at bedtime.   ONDANSETRON (ZOFRAN-ODT) 8 MG DISINTEGRATING TABLET    Take 8 mg by mouth as needed for nausea or vomiting.   PANCRELIPASE, LIP-PROT-AMYL, (CREON) 24000-76000 UNITS CPEP    Take by mouth 3 (three) times daily with meals.   ROSUVASTATIN (CRESTOR) 10 MG TABLET    Take one tablet by mouth once daily for cholesterol.   SERTRALINE (ZOLOFT) 100 MG TABLET    Take 200 mg by mouth every morning.  Modified Medications   No medications on file  Discontinued Medications   LATISSE 0.03 % OPHTHALMIC SOLUTION    APPLY 1 DROP VIA APPLICATOR ONCE DAILY AS DIRECTED   ONDANSETRON (ZOFRAN-ODT) 4 MG DISINTEGRATING TABLET    Take 2 tablets (8 mg total) by mouth 2 (two) times a day.   ZINC GLUCONATE 50 MG TABLET    Take 50 mg by mouth daily.    Physical Exam:  There were no vitals filed for this visit. There is no height or weight on file to calculate BMI. Wt Readings from Last 3 Encounters:  11/16/18 111 lb (50.3 kg)  03/21/16 110 lb 6.4 oz (50.1 kg)  01/02/15 112 lb 3.2 oz (50.9 kg)     Labs reviewed: Basic Metabolic Panel: No results for input(s): NA, K, CL, CO2, GLUCOSE, BUN, CREATININE, CALCIUM, MG, PHOS, TSH in the last 8760 hours. Liver Function Tests: No results for input(s): AST, ALT, ALKPHOS, BILITOT, PROT, ALBUMIN in the last 8760 hours. No results for input(s): LIPASE, AMYLASE in the last 8760  hours. No results for input(s): AMMONIA in the last 8760 hours. CBC: No results for input(s): WBC, NEUTROABS, HGB, HCT, MCV, PLT in the last 8760 hours. Lipid Panel: No results for input(s): CHOL, HDL, LDLCALC, TRIG, CHOLHDL, LDLDIRECT in the last 8760 hours. TSH: No results for input(s): TSH in the last 8760 hours. A1C: Lab Results  Component Value Date   HGBA1C  04/03/2010    5.6 (NOTE)  According to the ADA Clinical Practice Recommendations for 2011, when HbA1c is used as a screening test:   >=6.5%   Diagnostic of Diabetes Mellitus           (if abnormal result  is confirmed)  5.7-6.4%   Increased risk of developing Diabetes Mellitus  References:Diagnosis and Classification of Diabetes Mellitus,Diabetes [id number] 1):S62-S69 and Standards of Medical Care in         Diabetes - 2011,Diabetes [id number]  (Suppl 1):S11-S61.     Assessment/Plan 1. Pancreatic insufficiency Suffers with chronic diarrhea, continues on creon and followed by GI.  - Amb Referral to Palliative Care  2. Chronic abdominal pain Ongoing, she has been referred to the pain clinic but without benefit would like palliative care to help manage pain. - Amb Referral to Palliative Care  3. Generalized anxiety disorder Ongoing, followed by pysch. Continues on zoloft and alprazolam   Charae Depaolis K. Harle Battiest  [facility] [phone]    Virtual Visit via telephone- unable to get conntected by video  I connected with patient on 01/14/21 at 11:30 AM EDT by telephone and verified that I am speaking with the correct person using two identifiers.  Location: Patient: home [facility]: [facility]   I discussed the limitations, risks, security and privacy concerns of performing an evaluation and management service by telephone and the availability of in person appointments. I also discussed with the patient that there  may be a patient responsible charge related to this service. The patient expressed understanding and agreed to proceed.   I discussed the assessment and treatment plan with the patient. The patient was provided an opportunity to ask questions and all were answered. The patient agreed with the plan and demonstrated an understanding of the instructions.   The patient was advised to call back or seek an in-person evaluation if the symptoms worsen or if the condition fails to improve as anticipated.  I provided 24 minutes of non-face-to-face time during this encounter.  Carlos American. Harle Battiest Avs printed and mailed

## 2021-05-25 DIAGNOSIS — F17210 Nicotine dependence, cigarettes, uncomplicated: Secondary | ICD-10-CM | POA: Diagnosis not present

## 2021-05-25 DIAGNOSIS — Z20822 Contact with and (suspected) exposure to covid-19: Secondary | ICD-10-CM | POA: Diagnosis not present

## 2021-05-25 DIAGNOSIS — Z5321 Procedure and treatment not carried out due to patient leaving prior to being seen by health care provider: Secondary | ICD-10-CM | POA: Diagnosis not present

## 2021-05-25 DIAGNOSIS — R109 Unspecified abdominal pain: Secondary | ICD-10-CM | POA: Diagnosis not present

## 2021-07-05 ENCOUNTER — Ambulatory Visit: Payer: Medicare Other | Admitting: Nurse Practitioner

## 2021-07-21 DEATH — deceased

## 2022-01-18 ENCOUNTER — Encounter: Payer: Medicare Other | Admitting: Nurse Practitioner
